# Patient Record
Sex: Male | Born: 1935 | Race: Black or African American | Hispanic: No | Marital: Married | Smoking: Former smoker
Health system: Southern US, Community
[De-identification: ages and names within clinical notes are randomized; demographics above are authoritative.]

## PROBLEM LIST (undated history)

## (undated) DIAGNOSIS — C61 Malignant neoplasm of prostate: Secondary | ICD-10-CM

## (undated) DIAGNOSIS — C9000 Multiple myeloma not having achieved remission: Secondary | ICD-10-CM

## (undated) DIAGNOSIS — I10 Essential (primary) hypertension: Secondary | ICD-10-CM

---

## 2013-10-01 ENCOUNTER — Emergency Department (HOSPITAL_COMMUNITY): Payer: Medicare Other

## 2013-10-01 ENCOUNTER — Encounter (HOSPITAL_COMMUNITY): Payer: Self-pay | Admitting: Emergency Medicine

## 2013-10-01 ENCOUNTER — Inpatient Hospital Stay (HOSPITAL_COMMUNITY)
Admission: EM | Admit: 2013-10-01 | Discharge: 2013-10-30 | DRG: 435 | Disposition: E | Payer: Medicare Other | Attending: Internal Medicine | Admitting: Internal Medicine

## 2013-10-01 DIAGNOSIS — Z23 Encounter for immunization: Secondary | ICD-10-CM

## 2013-10-01 DIAGNOSIS — C9000 Multiple myeloma not having achieved remission: Secondary | ICD-10-CM | POA: Diagnosis present

## 2013-10-01 DIAGNOSIS — Z66 Do not resuscitate: Secondary | ICD-10-CM | POA: Diagnosis present

## 2013-10-01 DIAGNOSIS — N179 Acute kidney failure, unspecified: Secondary | ICD-10-CM | POA: Diagnosis present

## 2013-10-01 DIAGNOSIS — J9600 Acute respiratory failure, unspecified whether with hypoxia or hypercapnia: Secondary | ICD-10-CM | POA: Diagnosis present

## 2013-10-01 DIAGNOSIS — R160 Hepatomegaly, not elsewhere classified: Secondary | ICD-10-CM | POA: Diagnosis present

## 2013-10-01 DIAGNOSIS — C61 Malignant neoplasm of prostate: Secondary | ICD-10-CM | POA: Diagnosis present

## 2013-10-01 DIAGNOSIS — Z87891 Personal history of nicotine dependence: Secondary | ICD-10-CM

## 2013-10-01 DIAGNOSIS — D649 Anemia, unspecified: Secondary | ICD-10-CM | POA: Diagnosis present

## 2013-10-01 DIAGNOSIS — E46 Unspecified protein-calorie malnutrition: Secondary | ICD-10-CM | POA: Diagnosis present

## 2013-10-01 DIAGNOSIS — C787 Secondary malignant neoplasm of liver and intrahepatic bile duct: Principal | ICD-10-CM | POA: Diagnosis present

## 2013-10-01 DIAGNOSIS — Z6835 Body mass index (BMI) 35.0-35.9, adult: Secondary | ICD-10-CM

## 2013-10-01 DIAGNOSIS — Z515 Encounter for palliative care: Secondary | ICD-10-CM

## 2013-10-01 DIAGNOSIS — R161 Splenomegaly, not elsewhere classified: Secondary | ICD-10-CM | POA: Diagnosis present

## 2013-10-01 DIAGNOSIS — R531 Weakness: Secondary | ICD-10-CM

## 2013-10-01 DIAGNOSIS — J189 Pneumonia, unspecified organism: Secondary | ICD-10-CM | POA: Diagnosis present

## 2013-10-01 DIAGNOSIS — R7989 Other specified abnormal findings of blood chemistry: Secondary | ICD-10-CM

## 2013-10-01 DIAGNOSIS — F40298 Other specified phobia: Secondary | ICD-10-CM | POA: Diagnosis present

## 2013-10-01 DIAGNOSIS — Z8546 Personal history of malignant neoplasm of prostate: Secondary | ICD-10-CM

## 2013-10-01 DIAGNOSIS — D638 Anemia in other chronic diseases classified elsewhere: Secondary | ICD-10-CM | POA: Diagnosis present

## 2013-10-01 DIAGNOSIS — C801 Malignant (primary) neoplasm, unspecified: Secondary | ICD-10-CM

## 2013-10-01 DIAGNOSIS — K5900 Constipation, unspecified: Secondary | ICD-10-CM | POA: Diagnosis present

## 2013-10-01 DIAGNOSIS — G9340 Encephalopathy, unspecified: Secondary | ICD-10-CM | POA: Diagnosis present

## 2013-10-01 DIAGNOSIS — Z79899 Other long term (current) drug therapy: Secondary | ICD-10-CM

## 2013-10-01 DIAGNOSIS — K810 Acute cholecystitis: Secondary | ICD-10-CM | POA: Diagnosis present

## 2013-10-01 DIAGNOSIS — C259 Malignant neoplasm of pancreas, unspecified: Secondary | ICD-10-CM | POA: Diagnosis present

## 2013-10-01 DIAGNOSIS — I10 Essential (primary) hypertension: Secondary | ICD-10-CM | POA: Diagnosis present

## 2013-10-01 DIAGNOSIS — Z7982 Long term (current) use of aspirin: Secondary | ICD-10-CM

## 2013-10-01 DIAGNOSIS — N19 Unspecified kidney failure: Secondary | ICD-10-CM | POA: Diagnosis present

## 2013-10-01 DIAGNOSIS — R945 Abnormal results of liver function studies: Secondary | ICD-10-CM

## 2013-10-01 DIAGNOSIS — R109 Unspecified abdominal pain: Secondary | ICD-10-CM | POA: Diagnosis present

## 2013-10-01 DIAGNOSIS — Z809 Family history of malignant neoplasm, unspecified: Secondary | ICD-10-CM

## 2013-10-01 HISTORY — DX: Malignant neoplasm of prostate: C61

## 2013-10-01 HISTORY — DX: Multiple myeloma not having achieved remission: C90.00

## 2013-10-01 HISTORY — DX: Essential (primary) hypertension: I10

## 2013-10-01 LAB — CBC
HCT: 26.5 % — ABNORMAL LOW (ref 39.0–52.0)
Hemoglobin: 8.4 g/dL — ABNORMAL LOW (ref 13.0–17.0)
MCH: 29.8 pg (ref 26.0–34.0)
MCHC: 31.7 g/dL (ref 30.0–36.0)
MCV: 94 fL (ref 78.0–100.0)
PLATELETS: 199 10*3/uL (ref 150–400)
RBC: 2.82 MIL/uL — ABNORMAL LOW (ref 4.22–5.81)
RDW: 18.1 % — ABNORMAL HIGH (ref 11.5–15.5)
WBC: 6.9 10*3/uL (ref 4.0–10.5)

## 2013-10-01 LAB — COMPREHENSIVE METABOLIC PANEL
ALBUMIN: 2.9 g/dL — AB (ref 3.5–5.2)
ALT: 102 U/L — ABNORMAL HIGH (ref 0–53)
AST: 107 U/L — ABNORMAL HIGH (ref 0–37)
Alkaline Phosphatase: 325 U/L — ABNORMAL HIGH (ref 39–117)
BILIRUBIN TOTAL: 1.6 mg/dL — AB (ref 0.3–1.2)
BUN: 32 mg/dL — AB (ref 6–23)
CALCIUM: 10.2 mg/dL (ref 8.4–10.5)
CHLORIDE: 103 meq/L (ref 96–112)
CO2: 20 mEq/L (ref 19–32)
Creatinine, Ser: 1.63 mg/dL — ABNORMAL HIGH (ref 0.50–1.35)
GFR calc Af Amer: 46 mL/min — ABNORMAL LOW (ref >90)
GFR calc non Af Amer: 39 mL/min — ABNORMAL LOW (ref >90)
Glucose, Bld: 158 mg/dL — ABNORMAL HIGH (ref 70–99)
Potassium: 5.4 mEq/L — ABNORMAL HIGH (ref 3.7–5.3)
Sodium: 139 mEq/L (ref 137–147)
Total Protein: 8 g/dL (ref 6.0–8.3)

## 2013-10-01 LAB — D-DIMER, QUANTITATIVE (NOT AT ARMC)

## 2013-10-01 LAB — URINALYSIS, ROUTINE W REFLEX MICROSCOPIC
Glucose, UA: NEGATIVE mg/dL
Hgb urine dipstick: NEGATIVE
Ketones, ur: 15 mg/dL — AB
Nitrite: NEGATIVE
PROTEIN: NEGATIVE mg/dL
SPECIFIC GRAVITY, URINE: 1.025 (ref 1.005–1.030)
UROBILINOGEN UA: 1 mg/dL (ref 0.0–1.0)
pH: 5 (ref 5.0–8.0)

## 2013-10-01 LAB — URINE MICROSCOPIC-ADD ON

## 2013-10-01 LAB — GLUCOSE, CAPILLARY: GLUCOSE-CAPILLARY: 156 mg/dL — AB (ref 70–99)

## 2013-10-01 LAB — POCT I-STAT TROPONIN I: TROPONIN I, POC: 0.03 ng/mL (ref 0.00–0.08)

## 2013-10-01 LAB — LIPASE, BLOOD: Lipase: 37 U/L (ref 11–59)

## 2013-10-01 LAB — ABO/RH: ABO/RH(D): B POS

## 2013-10-01 MED ORDER — IOHEXOL 350 MG/ML SOLN
100.0000 mL | Freq: Once | INTRAVENOUS | Status: AC | PRN
  Administered 2013-10-01: 100 mL via INTRAVENOUS

## 2013-10-01 MED ORDER — SODIUM CHLORIDE 0.9 % IV BOLUS (SEPSIS)
500.0000 mL | Freq: Once | INTRAVENOUS | Status: AC
  Administered 2013-10-02: 500 mL via INTRAVENOUS

## 2013-10-01 MED ORDER — SODIUM CHLORIDE 0.9 % IV BOLUS (SEPSIS)
500.0000 mL | Freq: Once | INTRAVENOUS | Status: AC
  Administered 2013-10-01: 500 mL via INTRAVENOUS

## 2013-10-01 MED ORDER — DEXTROSE 5 % IV SOLN: 1.0000 g | Freq: Once | INTRAVENOUS | Status: DC

## 2013-10-01 NOTE — ED Notes (Addendum)
Pt states he has felt SOB at night and having abd pain for past month but worse today. States he has felt 'weak and worn out." pt received blood transfusion at [facility] hospital recently for anemia per family member. A&Ox4, resp e/u

## 2013-10-01 NOTE — ED Notes (Signed)
Patient transported to X-ray 

## 2013-10-01 NOTE — ED Notes (Signed)
Brother - Wynetta Emery  [phone] Wife - Francesca Jewett [phone] [place] - [phone]

## 2013-10-01 NOTE — ED Notes (Signed)
Lab Drawn attempted in triage with no success, LAB aware

## 2013-10-01 NOTE — ED Provider Notes (Signed)
Discussed case with Dr. Purcell Mouton, PA-C. Transfer of care from Dr. Purcell Mouton, PA-C at change in shift. Plan is for admission of patient.  If d-dimer negative, give one unit of blood to the patient and CAP antibiotics.   Joah Patlan is a 79 year old male with past medical history of multiple myeloma, prostate cancer, hypertension presenting to the emergency department with dyspnea upon exertion, dizziness, feeling of fainting sensation, shortness of breath when walking. Reported that when patient lies down he feels better. Reported that approximately 2 weeks ago he was diagnosed as being anemic which she was given a blood transfusion. Reported that he's been experiencing upper abdominal pain-localize the right upper quadrant and epigastric region.  While in ED setting patient's heart rate has been tachycardic-with heart rate ranging between 120s. Patient is been given 500 cc fluid with heart rate resting approximately 110 beats per minute, when talking increases to 120s. Mild low-grade fever 100.3F, no cough noted.  Results for orders placed during the hospital encounter of 10/28/2013  CBC      Result Value Range   WBC 6.9  4.0 - 10.5 K/uL   RBC 2.82 (*) 4.22 - 5.81 MIL/uL   Hemoglobin 8.4 (*) 13.0 - 17.0 g/dL   HCT 26.5 (*) 39.0 - 52.0 %   MCV 94.0  78.0 - 100.0 fL   MCH 29.8  26.0 - 34.0 pg   MCHC 31.7  30.0 - 36.0 g/dL   RDW 18.1 (*) 11.5 - 15.5 %   Platelets 199  150 - 400 K/uL  COMPREHENSIVE METABOLIC PANEL      Result Value Range   Sodium 139  137 - 147 mEq/L   Potassium 5.4 (*) 3.7 - 5.3 mEq/L   Chloride 103  96 - 112 mEq/L   CO2 20  19 - 32 mEq/L   Glucose, Bld 158 (*) 70 - 99 mg/dL   BUN 32 (*) 6 - 23 mg/dL   Creatinine, Ser 1.63 (*) 0.50 - 1.35 mg/dL   Calcium 10.2  8.4 - 10.5 mg/dL   Total Protein 8.0  6.0 - 8.3 g/dL   Albumin 2.9 (*) 3.5 - 5.2 g/dL   AST 107 (*) 0 - 37 U/L   ALT 102 (*) 0 - 53 U/L   Alkaline Phosphatase 325 (*) 39 - 117 U/L   Total Bilirubin 1.6 (*)  0.3 - 1.2 mg/dL   GFR calc non Af Amer 39 (*) >90 mL/min   GFR calc Af Amer 46 (*) >90 mL/min  GLUCOSE, CAPILLARY      Result Value Range   Glucose-Capillary 156 (*) 70 - 99 mg/dL   Comment 1 Notify RN     Comment 2 Documented in Chart    LIPASE, BLOOD      Result Value Range   Lipase 37  11 - 59 U/L  POCT I-STAT TROPONIN I      Result Value Range   Troponin i, poc 0.03  0.00 - 0.08 ng/mL   Comment 3           TYPE AND SCREEN      Result Value Range   ABO/RH(D) B POS     Antibody Screen NEG     Sample Expiration 10/04/2013    ABO/RH      Result Value Range   ABO/RH(D) B POS     Dg Chest 2 View  10/11/2013   CLINICAL DATA:  Shortness of breath. Weakness. Multiple myeloma. Prostate carcinoma.  EXAM:  CHEST  2 VIEW  COMPARISON:  None.  FINDINGS: Low lung volumes are noted. Mild opacity seen at the right lung base which may be due to atelectasis or infiltrate. Left lung appears clear. No evidence of pleural effusion.  IMPRESSION: Low lung volumes.  Mild right basilar atelectasis versus infiltrate.   Electronically Signed   By: Earle Gell M.D.   On: 10/20/2013 18:44   US Abdomen Complete  10/02/2013   CLINICAL DATA:  Upper abdominal pain and shortness of breath. History multiple myeloma and prostate cancer.  EXAM: ULTRASOUND ABDOMEN COMPLETE  COMPARISON:  None.  FINDINGS: Gallbladder:  No evidence of cholelithiasis. Mild gallbladder wall thickening measuring 4.8 mm. Positive sonographic Murphy's sign.  Common bile duct:  Diameter: 6.7 to 8.1 mm.  Liver:  Heterogeneous echotexture with several rounded hypoechoic rimmed masses.  IVC:  No abnormality visualized.  Pancreas:  Visualized portion unremarkable.  Spleen:  Within normal in size with a 4.9 cm area of mild increased echogenicity as cannot exclude a mass.  Right Kidney:  Length: 11.9 cm. Two adjacent rounded hypo to anechoic masses over the upper pole with mild increased through transmission likely cysts. These measure 1.5 and 2.8 cm for  respectively.  Left Kidney:  Length: 11.6 cm. Echogenicity within normal limits. No mass or hydronephrosis visualized.  Abdominal aorta:  3.3 cm in AP diameter proximally.  Other findings:  None.  IMPRESSION: Heterogeneous echotexture to the liver with several rounded hypoechoic rimmed masses which may represent metastatic disease. 4.9 cm hyperechoic region of the spleen as cannot exclude a mass. Recommend CT of the abdomen/ pelvis for further evaluation.  No evidence of cholelithiasis. Mild gallbladder wall thickening measuring 4.8 mm with positive sonographic Murphy sign per recommend clinical correlation.  Two adjacent cysts over the superior pole of the right kidney measuring 1.5 and 2.8 cm respectively.  Minimal aneurysmal dilatation of the proximal abdominal aorta measuring 3.3 cm in AP diameter.   Electronically Signed   By: Marin Olp M.D.   On: 10/21/2013 22:08   11:24 PM This provider spoke with Dr. Donne Hazel, General surgery - discussed case, labs, history, presentation, imaging. Dr. Donne Hazel to assess patient.   12:13 AM This provider saw and re-assessed patient. Discussed lab findings and imaging results. Discussed with patient plan for admission. Pale conjunctiva bilaterally. Cap refill < 3 seconds. Negative leg swelling or pitting edema noted. Dry mucus membranes noted. Heart rate and rhythm normal, pulses palpable and strong radial and DP 2+ bilaterally. BS normoactive in all 4 quadrants. Discomfort upon palpation to the epigastric and RUQ - positive murphy's. Strength intact with equal distribution.   12:34 AM This provider spoke with Dr. Marlowe Sax from [facility]. Discussed case, presentation, history, labs, imaging, vitals. Recommended CT abdomen and pelvis to be discontinued. Recommended patient to be admitted to the hospital via Telemetry as inpatient. Patient stable for transfer. Discussed plan for patient to be admitted to the hospital. Patient agreed to plan and  understood.  Jamse Mead, PA-C 10/02/13 [phone]

## 2013-10-01 NOTE — ED Provider Notes (Signed)
CSN: [id number]     Arrival date & time 10/11/2013  1643 History   First MD Initiated Contact with Patient 10/14/2013 1709     Chief Complaint  Patient presents with  . Weakness   (Consider location/radiation/quality/duration/timing/severity/associated sxs/prior Treatment) Patient is a 79 y.o. male presenting with shortness of breath. The history is provided by the patient.  Shortness of Breath Severity:  Moderate Onset quality:  Gradual Duration:  2 weeks Timing:  Constant Progression:  Worsening Chronicity:  New Context comment:  Seen at [place] 2 weeks ago and told he was anemic and given blood transfusion but sx have worsened Relieved by:  Lying down and rest Worsened by:  Exertion and movement (standing) Ineffective treatments:  None tried Associated symptoms: abdominal pain   Associated symptoms: no chest pain, no cough, no fever, no sore throat, no sputum production, no vomiting and no wheezing   Associated symptoms comment:  Abdominal pain while breathing hard.  Lightheaded anytime he stands up.  Severe SOB with any exertion and fatigue Risk factors comment:  Multiple myeloma and anemia   Past Medical History  Diagnosis Date  . Prostate cancer   . Multiple myeloma   . Hypertension    History reviewed. No pertinent past surgical history. History reviewed. No pertinent family history. History  Substance Use Topics  . Smoking status: Former Research scientist (life sciences)  . Smokeless tobacco: Not on file  . Alcohol Use: No    Review of Systems  Constitutional: Positive for fatigue. Negative for fever.  HENT: Negative for sore throat.   Respiratory: Positive for shortness of breath. Negative for cough, sputum production and wheezing.   Cardiovascular: Negative for chest pain.  Gastrointestinal: Positive for abdominal pain. Negative for vomiting.  All other systems reviewed and are negative.    Allergies  Review of patient's allergies indicates no known allergies.  Home Medications  No  current outpatient prescriptions on file. BP 107/76  Pulse 126  Temp(Src) 100.3 F (37.9 C) (Oral)  Resp 26  SpO2 100% Physical Exam  Nursing note and vitals reviewed. Constitutional: He is oriented to person, place, and time. He appears well-developed and well-nourished. No distress.  HENT:  Head: Normocephalic and atraumatic.  Mouth/Throat: Oropharynx is clear and moist. Mucous membranes are dry.  Eyes: EOM are normal. Pupils are equal, round, and reactive to light.  Pale conjuctiva  Neck: Normal range of motion. Neck supple.  Cardiovascular: Regular rhythm and intact distal pulses.  Tachycardia present.   No murmur heard. Pulmonary/Chest: Effort normal and breath sounds normal. No respiratory distress. He has no wheezes. He has no rales.  Abdominal: Soft. He exhibits no distension. There is tenderness in the right upper quadrant, epigastric area and left upper quadrant. There is no rebound and no guarding.    Musculoskeletal: Normal range of motion. He exhibits no edema and no tenderness.  Neurological: He is alert and oriented to person, place, and time.  Skin: Skin is warm and dry. No rash noted. No erythema. There is pallor.  Psychiatric: He has a normal mood and affect. His behavior is normal.    ED Course  Procedures (including critical care time) Labs Review Labs Reviewed  CBC - Abnormal; Notable for the following:    RBC 2.82 (*)    Hemoglobin 8.4 (*)    HCT 26.5 (*)    RDW 18.1 (*)    All other components within normal limits  COMPREHENSIVE METABOLIC PANEL - Abnormal; Notable for the following:    Potassium 5.4 (*)  Glucose, Bld 158 (*)    BUN 32 (*)    Creatinine, Ser 1.63 (*)    Albumin 2.9 (*)    AST 107 (*)    ALT 102 (*)    Alkaline Phosphatase 325 (*)    Total Bilirubin 1.6 (*)    GFR calc non Af Amer 39 (*)    GFR calc Af Amer 46 (*)    All other components within normal limits  URINALYSIS, ROUTINE W REFLEX MICROSCOPIC - Abnormal; Notable for the  following:    Color, Urine AMBER (*)    APPearance CLOUDY (*)    Bilirubin Urine MODERATE (*)    Ketones, ur 15 (*)    Leukocytes, UA TRACE (*)    All other components within normal limits  GLUCOSE, CAPILLARY - Abnormal; Notable for the following:    Glucose-Capillary 156 (*)    All other components within normal limits  D-DIMER, QUANTITATIVE - Abnormal; Notable for the following:    D-Dimer, Quant >20.00 (*)    All other components within normal limits  URINE MICROSCOPIC-ADD ON - Abnormal; Notable for the following:    Bacteria, UA MANY (*)    Casts HYALINE CASTS (*)    All other components within normal limits  CBC WITH DIFFERENTIAL - Abnormal; Notable for the following:    RBC 2.34 (*)    Hemoglobin 7.1 (*)    HCT 21.7 (*)    RDW 17.3 (*)    All other components within normal limits  COMPREHENSIVE METABOLIC PANEL - Abnormal; Notable for the following:    Glucose, Bld 119 (*)    BUN 36 (*)    Creatinine, Ser 1.63 (*)    Albumin 2.5 (*)    AST 84 (*)    ALT 83 (*)    Alkaline Phosphatase 276 (*)    Total Bilirubin 1.3 (*)    GFR calc non Af Amer 39 (*)    GFR calc Af Amer 46 (*)    All other components within normal limits  PROTIME-INR - Abnormal; Notable for the following:    Prothrombin Time 16.3 (*)    All other components within normal limits  CULTURE, BLOOD (ROUTINE X 2)  CULTURE, BLOOD (ROUTINE X 2)  CULTURE, EXPECTORATED SPUTUM-ASSESSMENT  GRAM STAIN  LIPASE, BLOOD  LEGIONELLA ANTIGEN, URINE  STREP PNEUMONIAE URINARY ANTIGEN  POCT I-STAT TROPONIN I  POCT I-STAT TROPONIN I  TYPE AND SCREEN  ABO/RH   Imaging Review Dg Chest 2 View  10/22/2013   CLINICAL DATA:  Shortness of breath. Weakness. Multiple myeloma. Prostate carcinoma.  EXAM: CHEST  2 VIEW  COMPARISON:  None.  FINDINGS: Low lung volumes are noted. Mild opacity seen at the right lung base which may be due to atelectasis or infiltrate. Left lung appears clear. No evidence of pleural effusion.   IMPRESSION: Low lung volumes.  Mild right basilar atelectasis versus infiltrate.   Electronically Signed   By: Earle Gell M.D.   On: 10/04/2013 18:44   Ct Angio Chest Pe W/cm &/or Wo Cm  10/02/2013   CLINICAL DATA:  Shortness of breath. Elevated D-dimer. Clinical suspicion for pulmonary embolism. History of prostate carcinoma and multiple myeloma.  EXAM: CT ANGIOGRAPHY CHEST WITH CONTRAST  TECHNIQUE: Multidetector CT imaging of the chest was performed using the standard protocol during bolus administration of intravenous contrast. Multiplanar CT image reconstructions including MIPs were obtained to evaluate the vascular anatomy.  CONTRAST:  163m OMNIPAQUE IOHEXOL 350 MG/ML SOLN  COMPARISON:  None.  FINDINGS: Satisfactory opacification of pulmonary arteries noted, and no pulmonary emboli identified. No evidence of thoracic aortic dissection or aneurysm. No evidence of mediastinal hematoma or mass.  No lymphadenopathy identified within the thorax. Tiny pleural effusions versus pleural thickening noted bilaterally. Mild cardiomegaly noted. No evidence of pulmonary infiltrate or mass.  Images obtained through the upper abdomen show heterogeneous attenuation of the liver, which may be due to the presence of multiple hypovascular liver metastases.  Review of the MIP images confirms the above findings.  IMPRESSION: No evidence of pulmonary embolism.  Mild cardiomegaly and tiny bilateral pleural effusions versus pleural thickening.  Heterogeneous appearance of liver noted, and multiple hypovascular metastases cannot be excluded. Recommend further imaging characterization with nonemergent abdomen MRI without and with contrast as the preferred exam.   Electronically Signed   By: Earle Gell M.D.   On: 10/02/2013 00:07   US Abdomen Complete  10/13/2013   CLINICAL DATA:  Upper abdominal pain and shortness of breath. History multiple myeloma and prostate cancer.  EXAM: ULTRASOUND ABDOMEN COMPLETE  COMPARISON:  None.   FINDINGS: Gallbladder:  No evidence of cholelithiasis. Mild gallbladder wall thickening measuring 4.8 mm. Positive sonographic Murphy's sign.  Common bile duct:  Diameter: 6.7 to 8.1 mm.  Liver:  Heterogeneous echotexture with several rounded hypoechoic rimmed masses.  IVC:  No abnormality visualized.  Pancreas:  Visualized portion unremarkable.  Spleen:  Within normal in size with a 4.9 cm area of mild increased echogenicity as cannot exclude a mass.  Right Kidney:  Length: 11.9 cm. Two adjacent rounded hypo to anechoic masses over the upper pole with mild increased through transmission likely cysts. These measure 1.5 and 2.8 cm for respectively.  Left Kidney:  Length: 11.6 cm. Echogenicity within normal limits. No mass or hydronephrosis visualized.  Abdominal aorta:  3.3 cm in AP diameter proximally.  Other findings:  None.  IMPRESSION: Heterogeneous echotexture to the liver with several rounded hypoechoic rimmed masses which may represent metastatic disease. 4.9 cm hyperechoic region of the spleen as cannot exclude a mass. Recommend CT of the abdomen/ pelvis for further evaluation.  No evidence of cholelithiasis. Mild gallbladder wall thickening measuring 4.8 mm with positive sonographic Murphy sign per recommend clinical correlation.  Two adjacent cysts over the superior pole of the right kidney measuring 1.5 and 2.8 cm respectively.  Minimal aneurysmal dilatation of the proximal abdominal aorta measuring 3.3 cm in AP diameter.   Electronically Signed   By: Marin Olp M.D.   On: 10/26/2013 22:08    EKG Interpretation    Date/Time:  Saturday October 01 2013 16:53:36 EST Ventricular Rate:  139 PR Interval:  124 QRS Duration: 94 QT Interval:  290 QTC Calculation: 441 R Axis:   -41 Text Interpretation:  Sinus tachycardia Left axis deviation Moderate voltage criteria for LVH, may be normal variant Nonspecific ST abnormality No previous tracing Confirmed by Maryan Rued  MD, Abel Hageman ([id number]) on 10/05/2013  5:12:06 PM            MDM  No diagnosis found.  Pt with sx concerning for anemia with SOB, lightheadness and fatigue when standing up and walking any distance that has worsened over the last 2 weeks.  Denies infectious sx, CP, cough or fluid overload.  No signs of fluid overload on exam and sinus tachy on ekg.  Pale conjunctiva and concern for anemia.  Pt has hx of MM and blood transfusion 2 weeks ago at the [place] but no improvement of sx.  No neuro  complaints.  Pt is assymptomatic while lying down but still tachy.  Also appears dehydrated.  CBC, CMP, UA, troponin, type and screen, chest x-ray pending.  Hb 8.4 without baseline which could be the cause of sx however given tachy, sob and abd pain will get d-dimer and U/S to further eval.   Blanchie Dessert, MD 10/02/13 1114

## 2013-10-02 ENCOUNTER — Inpatient Hospital Stay (HOSPITAL_COMMUNITY): Payer: Medicare Other

## 2013-10-02 ENCOUNTER — Other Ambulatory Visit (HOSPITAL_COMMUNITY): Payer: Medicare Other

## 2013-10-02 DIAGNOSIS — C9000 Multiple myeloma not having achieved remission: Secondary | ICD-10-CM

## 2013-10-02 DIAGNOSIS — K810 Acute cholecystitis: Secondary | ICD-10-CM | POA: Insufficient documentation

## 2013-10-02 DIAGNOSIS — I10 Essential (primary) hypertension: Secondary | ICD-10-CM

## 2013-10-02 DIAGNOSIS — R161 Splenomegaly, not elsewhere classified: Secondary | ICD-10-CM | POA: Diagnosis present

## 2013-10-02 DIAGNOSIS — J189 Pneumonia, unspecified organism: Secondary | ICD-10-CM

## 2013-10-02 DIAGNOSIS — C61 Malignant neoplasm of prostate: Secondary | ICD-10-CM

## 2013-10-02 DIAGNOSIS — D490 Neoplasm of unspecified behavior of digestive system: Secondary | ICD-10-CM

## 2013-10-02 DIAGNOSIS — R160 Hepatomegaly, not elsewhere classified: Secondary | ICD-10-CM | POA: Diagnosis present

## 2013-10-02 DIAGNOSIS — K769 Liver disease, unspecified: Secondary | ICD-10-CM

## 2013-10-02 DIAGNOSIS — R109 Unspecified abdominal pain: Secondary | ICD-10-CM

## 2013-10-02 DIAGNOSIS — D649 Anemia, unspecified: Secondary | ICD-10-CM | POA: Diagnosis present

## 2013-10-02 DIAGNOSIS — N19 Unspecified kidney failure: Secondary | ICD-10-CM | POA: Diagnosis present

## 2013-10-02 LAB — CBC WITH DIFFERENTIAL/PLATELET
BASOS PCT: 0 % (ref 0–1)
Basophils Absolute: 0 10*3/uL (ref 0.0–0.1)
EOS ABS: 0 10*3/uL (ref 0.0–0.7)
Eosinophils Relative: 0 % (ref 0–5)
HCT: 21.7 % — ABNORMAL LOW (ref 39.0–52.0)
Hemoglobin: 7.1 g/dL — ABNORMAL LOW (ref 13.0–17.0)
Lymphocytes Relative: 14 % (ref 12–46)
Lymphs Abs: 0.9 10*3/uL (ref 0.7–4.0)
MCH: 30.3 pg (ref 26.0–34.0)
MCHC: 32.7 g/dL (ref 30.0–36.0)
MCV: 92.7 fL (ref 78.0–100.0)
Monocytes Absolute: 0.7 10*3/uL (ref 0.1–1.0)
Monocytes Relative: 11 % (ref 3–12)
Neutro Abs: 4.9 10*3/uL (ref 1.7–7.7)
Neutrophils Relative %: 75 % (ref 43–77)
PLATELETS: 153 10*3/uL (ref 150–400)
RBC: 2.34 MIL/uL — ABNORMAL LOW (ref 4.22–5.81)
RDW: 17.3 % — ABNORMAL HIGH (ref 11.5–15.5)
WBC: 6.5 10*3/uL (ref 4.0–10.5)

## 2013-10-02 LAB — COMPREHENSIVE METABOLIC PANEL
ALK PHOS: 276 U/L — AB (ref 39–117)
ALT: 83 U/L — AB (ref 0–53)
AST: 84 U/L — ABNORMAL HIGH (ref 0–37)
Albumin: 2.5 g/dL — ABNORMAL LOW (ref 3.5–5.2)
BUN: 36 mg/dL — ABNORMAL HIGH (ref 6–23)
CO2: 21 mEq/L (ref 19–32)
Calcium: 9.5 mg/dL (ref 8.4–10.5)
Chloride: 105 mEq/L (ref 96–112)
Creatinine, Ser: 1.63 mg/dL — ABNORMAL HIGH (ref 0.50–1.35)
GFR calc Af Amer: 46 mL/min — ABNORMAL LOW (ref >90)
GFR calc non Af Amer: 39 mL/min — ABNORMAL LOW (ref >90)
Glucose, Bld: 119 mg/dL — ABNORMAL HIGH (ref 70–99)
Potassium: 4.3 mEq/L (ref 3.7–5.3)
Sodium: 138 mEq/L (ref 137–147)
TOTAL PROTEIN: 7.2 g/dL (ref 6.0–8.3)
Total Bilirubin: 1.3 mg/dL — ABNORMAL HIGH (ref 0.3–1.2)

## 2013-10-02 LAB — MAGNESIUM: MAGNESIUM: 2.2 mg/dL (ref 1.5–2.5)

## 2013-10-02 LAB — POCT I-STAT TROPONIN I: Troponin i, poc: 0.07 ng/mL (ref 0.00–0.08)

## 2013-10-02 LAB — STREP PNEUMONIAE URINARY ANTIGEN: Strep Pneumo Urinary Antigen: NEGATIVE

## 2013-10-02 LAB — PROTIME-INR
INR: 1.34 (ref 0.00–1.49)
Prothrombin Time: 16.3 seconds — ABNORMAL HIGH (ref 11.6–15.2)

## 2013-10-02 LAB — PREPARE RBC (CROSSMATCH)

## 2013-10-02 MED ORDER — PIPERACILLIN-TAZOBACTAM 3.375 G IVPB
3.3750 g | Freq: Three times a day (TID) | INTRAVENOUS | Status: DC
  Administered 2013-10-02 – 2013-10-04 (×7): 3.375 g via INTRAVENOUS
  Filled 2013-10-02 (×8): qty 50

## 2013-10-02 MED ORDER — DEXTROSE 5 % IV SOLN
1.0000 g | Freq: Once | INTRAVENOUS | Status: AC
  Administered 2013-10-02: 1 g via INTRAVENOUS
  Filled 2013-10-02: qty 10

## 2013-10-02 MED ORDER — VANCOMYCIN HCL 10 G IV SOLR
2000.0000 mg | Freq: Once | INTRAVENOUS | Status: AC
  Administered 2013-10-02: 2000 mg via INTRAVENOUS
  Filled 2013-10-02: qty 2000

## 2013-10-02 MED ORDER — TERAZOSIN HCL 5 MG PO CAPS
20.0000 mg | ORAL_CAPSULE | Freq: Every day | ORAL | Status: DC
  Administered 2013-10-02 – 2013-10-13 (×12): 20 mg via ORAL
  Filled 2013-10-02 (×14): qty 4

## 2013-10-02 MED ORDER — BIOTENE DRY MOUTH MT LIQD
15.0000 mL | Freq: Two times a day (BID) | OROMUCOSAL | Status: DC
  Administered 2013-10-02 – 2013-10-18 (×29): 15 mL via OROMUCOSAL

## 2013-10-02 MED ORDER — ALLOPURINOL 100 MG PO TABS
100.0000 mg | ORAL_TABLET | Freq: Every day | ORAL | Status: DC
  Administered 2013-10-02 – 2013-10-14 (×13): 100 mg via ORAL
  Filled 2013-10-02 (×13): qty 1

## 2013-10-02 MED ORDER — LOSARTAN POTASSIUM 25 MG PO TABS
25.0000 mg | ORAL_TABLET | Freq: Every day | ORAL | Status: DC
  Administered 2013-10-02: 13:00:00 25 mg via ORAL
  Filled 2013-10-02 (×2): qty 1

## 2013-10-02 MED ORDER — MORPHINE SULFATE 2 MG/ML IJ SOLN
1.0000 mg | INTRAMUSCULAR | Status: DC | PRN
Start: 2013-10-02 — End: 2013-10-02

## 2013-10-02 MED ORDER — TECHNETIUM TC 99M MEBROFENIN IV KIT
5.0000 | PACK | Freq: Once | INTRAVENOUS | Status: AC | PRN
  Administered 2013-10-02: 11:00:00 5 via INTRAVENOUS

## 2013-10-02 MED ORDER — PNEUMOCOCCAL VAC POLYVALENT 25 MCG/0.5ML IJ INJ
0.5000 mL | INJECTION | INTRAMUSCULAR | Status: AC
  Administered 2013-10-03: 10:00:00 0.5 mL via INTRAMUSCULAR
  Filled 2013-10-02: qty 0.5

## 2013-10-02 MED ORDER — SODIUM CHLORIDE 0.9 % IV SOLN
INTRAVENOUS | Status: DC
  Administered 2013-10-02 – 2013-10-03 (×2): via INTRAVENOUS
  Administered 2013-10-04: 17:00:00 1000 mL via INTRAVENOUS
  Administered 2013-10-06: 20:00:00 via INTRAVENOUS

## 2013-10-02 MED ORDER — HYDRALAZINE HCL 20 MG/ML IJ SOLN
10.0000 mg | INTRAMUSCULAR | Status: DC | PRN
  Administered 2013-10-05 – 2013-10-15 (×4): 10 mg via INTRAVENOUS
  Filled 2013-10-02 (×4): qty 1

## 2013-10-02 MED ORDER — ASPIRIN EC 325 MG PO TBEC
325.0000 mg | DELAYED_RELEASE_TABLET | Freq: Every day | ORAL | Status: DC
  Administered 2013-10-02 – 2013-10-14 (×13): 325 mg via ORAL
  Filled 2013-10-02 (×13): qty 1

## 2013-10-02 MED ORDER — DEXTROSE 5 % IV SOLN
500.0000 mg | Freq: Once | INTRAVENOUS | Status: AC
  Administered 2013-10-02: 500 mg via INTRAVENOUS

## 2013-10-02 MED ORDER — SODIUM CHLORIDE 0.9 % IV SOLN
1750.0000 mg | INTRAVENOUS | Status: DC
  Administered 2013-10-02 – 2013-10-04 (×2): 1750 mg via INTRAVENOUS
  Filled 2013-10-02 (×3): qty 1750

## 2013-10-02 MED ORDER — SENNOSIDES-DOCUSATE SODIUM 8.6-50 MG PO TABS
1.0000 | ORAL_TABLET | Freq: Two times a day (BID) | ORAL | Status: DC
  Administered 2013-10-02 – 2013-10-16 (×28): 1 via ORAL
  Filled 2013-10-02 (×41): qty 1

## 2013-10-02 MED ORDER — PIPERACILLIN-TAZOBACTAM 3.375 G IVPB
3.3750 g | Freq: Four times a day (QID) | INTRAVENOUS | Status: DC
  Administered 2013-10-02 (×2): 3.375 g via INTRAVENOUS
  Filled 2013-10-02 (×3): qty 50

## 2013-10-02 MED ORDER — METOPROLOL TARTRATE 50 MG PO TABS
50.0000 mg | ORAL_TABLET | Freq: Two times a day (BID) | ORAL | Status: DC
  Administered 2013-10-02 – 2013-10-03 (×4): 50 mg via ORAL
  Filled 2013-10-02 (×8): qty 1

## 2013-10-02 MED ORDER — HEPARIN SODIUM (PORCINE) 5000 UNIT/ML IJ SOLN
5000.0000 [IU] | Freq: Three times a day (TID) | INTRAMUSCULAR | Status: DC
  Administered 2013-10-02 – 2013-10-03 (×2): 5000 [IU] via SUBCUTANEOUS
  Filled 2013-10-02 (×7): qty 1

## 2013-10-02 NOTE — ED Notes (Signed)
Pt. States he uses 2 canes to ambulate at home.

## 2013-10-02 NOTE — Progress Notes (Signed)
1243: tele alert pt had 5 beats of VTach transporting back from [facility]. Dr. Grandville Silos paged and made aware.

## 2013-10-02 NOTE — H&P (Signed)
[facility] History and Physical  Patient: Gilbert Deleon  QQP:[id number]  DOB: April 22, 1935  DOS: the patient was seen and examined on 10/02/2013 PCP: No PCP Per Patient  Chief Complaint: Shortness of breath  HPI: Gilbert Deleon is a 79 y.o. male with Past medical history of Multiple myelomaNot on any therapy since August 2013, prostate cancer treated with hormonal therapy only. The patient is coming from home. Patient presents with complaints of shortness of breath that has been ongoing on exertion since last 2-3 months. He mentions that the shortness of breath was pretty much stable on distance last 1 week weight progressively got worse and today was worse since morning. He did Have some cough Without any sputum production. He denies any chest pain, palpitation. He felt feverish but no chills. He denies any nausea or vomiting. He does mention about heartburn and midepigastric pain.  On further questioning of right upper Quadrant pain He mentions That he has this pain since last 2 months and it is not associated with meal. He denies any diarrhea but mentions that he has chronic constipation which can go for a week. He also mentions about bloating sensation in his abdomen and belching. He mentions in 2010 he was diagnosed with Prostate cancer and multiple myeloma. 4 prostate cancer he has undergone only hormonal therapy without any chemotherapy or radiation or surgery. 4 multiple myeloma he was placed on thalidomide, mycophenolate, Revlimid with prednisone and since August 2013 he's not taking any medication. All this treatment has been done at [place]. 2 weeks ago he was admitted at [facility] and was also given blood transfusion for anemia. He mentions that he was admitted at [facility] 3 times in last 1 month due to complaints of generalized weakness.  Review of Systems: as mentioned in the history of present illness.  A Comprehensive review of the other systems is negative.  Past Medical History   Diagnosis Date  . Prostate cancer   . Multiple myeloma   . Hypertension    History reviewed. No pertinent past surgical history. Social History:  reports that he has quit smoking. He does not have any smokeless tobacco history on file. He reports that he does not drink alcohol or use illicit drugs. Independent for most of his  ADL.  No Known Allergies  History reviewed. No pertinent family history.  Prior to Admission medications   Medication Sig Start Date End Date Taking? Authorizing Provider  allopurinol (ZYLOPRIM) 100 MG tablet Take 100 mg by mouth daily.   Yes Historical Provider, MD  aspirin EC 325 MG tablet Take 325 mg by mouth daily.   Yes Historical Provider, MD  Calcium Carbonate-Vitamin D (CALCIUM 600+D) 600-400 MG-UNIT per tablet Take 1 tablet by mouth daily.   Yes Historical Provider, MD  Glucosamine-Chondroitin (GLUCOSAMINE CHONDR COMPLEX PO) Take 1 tablet by mouth daily.   Yes Historical Provider, MD  latanoprost (XALATAN) 0.005 % ophthalmic solution Place 1 drop into both eyes at bedtime.   Yes Historical Provider, MD  losartan (COZAAR) 25 MG tablet Take 25 mg by mouth daily.   Yes Historical Provider, MD  metoprolol (LOPRESSOR) 50 MG tablet Take 50 mg by mouth 2 (two) times daily.   Yes Historical Provider, MD  Petrolatum-Zinc Oxide (SENSI-CARE PROTECTIVE BARRIER EX) Apply 1 application topically 2 (two) times daily.   Yes Historical Provider, MD  sennosides-docusate sodium (SENOKOT-S) 8.6-50 MG tablet Take 1 tablet by mouth 2 (two) times daily.   Yes Historical Provider, MD  spironolactone (ALDACTONE) 25  MG tablet Take 25 mg by mouth daily.   Yes Historical Provider, MD  terazosin (HYTRIN) 10 MG capsule Take 20 mg by mouth at bedtime.   Yes Historical Provider, MD    Physical Exam: Filed Vitals:   10/02/13 0015 10/02/13 0030 10/02/13 0045 10/02/13 0100  BP: 170/75 161/81 150/76 153/69  Pulse: 96 92 91 88  Temp:      TempSrc:      Resp: 23 24 21 23   SpO2: 99%  99% 100% 98%    General: Alert, Awake and Oriented to Time, Place and Person. Appear in moderate distress Eyes: PERRL ENT: Oral Mucosa clear moist. Neck: no JVD Cardiovascular: S1 and S2 Present, no Murmur, Peripheral Pulses Present Respiratory: Bilateral Air entry equal and Decreased, Basal Crackles,no wheezes Abdomen: Bowel Sound Present, Soft and Right upper quadrant tender Skin: no Rash Extremities: no Pedal edema, no calf tenderness Neurologic: Grossly Unremarkable.  Labs on Admission:  CBC:  Recent Labs Lab 10/06/2013 1812  WBC 6.9  HGB 8.4*  HCT 26.5*  MCV 94.0  PLT 199    CMP     Component Value Date/Time   NA 139 10/21/2013 1812   K 5.4* 10/21/2013 1812   CL 103 10/08/2013 1812   CO2 20 10/09/2013 1812   GLUCOSE 158* 10/14/2013 1812   BUN 32* 10/29/2013 1812   CREATININE 1.63* 10/28/2013 1812   CALCIUM 10.2 10/22/2013 1812   PROT 8.0 10/23/2013 1812   ALBUMIN 2.9* 10/23/2013 1812   AST 107* 10/18/2013 1812   ALT 102* 10/11/2013 1812   ALKPHOS 325* 10/26/2013 1812   BILITOT 1.6* 10/15/2013 1812   GFRNONAA 39* 10/10/2013 1812   GFRAA 46* 10/12/2013 1812     Recent Labs Lab 10/18/2013 1812  LIPASE 37   No results found for this basename: AMMONIA,  in the last 168 hours  No results found for this basename: CKTOTAL, CKMB, CKMBINDEX, TROPONINI,  in the last 168 hours BNP (last 3 results) No results found for this basename: PROBNP,  in the last 8760 hours  Radiological Exams on Admission: Dg Chest 2 View  10/14/2013   CLINICAL DATA:  Shortness of breath. Weakness. Multiple myeloma. Prostate carcinoma.  EXAM: CHEST  2 VIEW  COMPARISON:  None.  FINDINGS: Low lung volumes are noted. Mild opacity seen at the right lung base which may be due to atelectasis or infiltrate. Left lung appears clear. No evidence of pleural effusion.  IMPRESSION: Low lung volumes.  Mild right basilar atelectasis versus infiltrate.   Electronically Signed   By: Gilbert Deleon M.D.   On: 10/24/2013 18:44   Ct Angio  Chest Pe W/cm &/or Wo Cm  10/02/2013   CLINICAL DATA:  Shortness of breath. Elevated D-dimer. Clinical suspicion for pulmonary embolism. History of prostate carcinoma and multiple myeloma.  EXAM: CT ANGIOGRAPHY CHEST WITH CONTRAST  TECHNIQUE: Multidetector CT imaging of the chest was performed using the standard protocol during bolus administration of intravenous contrast. Multiplanar CT image reconstructions including MIPs were obtained to evaluate the vascular anatomy.  CONTRAST:  115m OMNIPAQUE IOHEXOL 350 MG/ML SOLN  COMPARISON:  None.  FINDINGS: Satisfactory opacification of pulmonary arteries noted, and no pulmonary emboli identified. No evidence of thoracic aortic dissection or aneurysm. No evidence of mediastinal hematoma or mass.  No lymphadenopathy identified within the thorax. Tiny pleural effusions versus pleural thickening noted bilaterally. Mild cardiomegaly noted. No evidence of pulmonary infiltrate or mass.  Images obtained through the upper abdomen show heterogeneous attenuation of the liver,  which may be due to the presence of multiple hypovascular liver metastases.  Review of the MIP images confirms the above findings.  IMPRESSION: No evidence of pulmonary embolism.  Mild cardiomegaly and tiny bilateral pleural effusions versus pleural thickening.  Heterogeneous appearance of liver noted, and multiple hypovascular metastases cannot be excluded. Recommend further imaging characterization with nonemergent abdomen MRI without and with contrast as the preferred exam.   Electronically Signed   By: Gilbert Deleon M.D.   On: 10/02/2013 00:07   US Abdomen Complete  10/23/2013   CLINICAL DATA:  Upper abdominal pain and shortness of breath. History multiple myeloma and prostate cancer.  EXAM: ULTRASOUND ABDOMEN COMPLETE  COMPARISON:  None.  FINDINGS: Gallbladder:  No evidence of cholelithiasis. Mild gallbladder wall thickening measuring 4.8 mm. Positive sonographic Murphy's sign.  Common bile duct:   Diameter: 6.7 to 8.1 mm.  Liver:  Heterogeneous echotexture with several rounded hypoechoic rimmed masses.  IVC:  No abnormality visualized.  Pancreas:  Visualized portion unremarkable.  Spleen:  Within normal in size with a 4.9 cm area of mild increased echogenicity as cannot exclude a mass.  Right Kidney:  Length: 11.9 cm. Two adjacent rounded hypo to anechoic masses over the upper pole with mild increased through transmission likely cysts. These measure 1.5 and 2.8 cm for respectively.  Left Kidney:  Length: 11.6 cm. Echogenicity within normal limits. No mass or hydronephrosis visualized.  Abdominal aorta:  3.3 cm in AP diameter proximally.  Other findings:  None.  IMPRESSION: Heterogeneous echotexture to the liver with several rounded hypoechoic rimmed masses which may represent metastatic disease. 4.9 cm hyperechoic region of the spleen as cannot exclude a mass. Recommend CT of the abdomen/ pelvis for further evaluation.  No evidence of cholelithiasis. Mild gallbladder wall thickening measuring 4.8 mm with positive sonographic Murphy sign per recommend clinical correlation.  Two adjacent cysts over the superior pole of the right kidney measuring 1.5 and 2.8 cm respectively.  Minimal aneurysmal dilatation of the proximal abdominal aorta measuring 3.3 cm in AP diameter.   Electronically Signed   By: Marin Olp M.D.   On: 10/17/2013 22:08     Assessment/Plan Principal Problem:   Acute cholecystitis Active Problems:   Multiple myeloma   Prostate cancer   Anemia   Renal failure   Liver mass   Splenic mass   Hypertension   1. Acute cholecystitis The patient is presenting with complaints of cough and shortness of breath. His shortness of breath has been progressively worsening on exertion since last 2 months and acutely got worse this morning associated with dizziness and tachycardia.  His cough has been ongoing since last one week. He has undergone a CT chest NG tube to rule out pulmonary  embolism which was negative for any PE. A CT chest did show that he has mild Cardiomegaly and possible atelectasis. He also undergone an ultrasound of the abdomen due to his complaint of right upper quadrant pain on exam and worsened LFT which did show possible acute cholecystitis. Surgery has been constulted. Due to his complaint of cough and shortness of breath as well as acute cholecystitis he was treated broadly with IV vancomycin and IV Zosyn due to his history of multiple myeloma.  2.Liver mass as well as a splenic mass His CT scan of the chest as well as ultrasound of the abdomen does show heterogenous attenuation in the liver and spleen. For which an MRI is recommended for further workup. At present the patient mentions that  he is claustrophobic and can only go for an MRI with an open setup. I recommended him sedation and an MRI which he thinks would not be feasible for him. Further Workup based on surgical consult Which is done for acute cholecystitis.  3.Renal failure Likely secondary to multiple myeloma. The patient has received contrast for CT chest therefore I would avoid CT abdomen and pelvis with contrast which is required for liver evaluation. Continue to monitor his BMP. Continue gentle hydration. Avoid nephrotoxic medication.  4.Accelerated hypertension Continue his home antihypertensive medication and when necessary hydralazine.  Consults: General surgeon  DVT Prophylaxis: subcutaneous Heparin Nutrition: N.p.o.  Code Status: Full  Disposition: Admitted to inpatient in telemetry unit.  Author: Berle Mull, MD [facility] Pager: [phone] 10/02/2013, 2:33 AM    If 7PM-7AM, please contact night-coverage [email or web address] Password [email or web address]

## 2013-10-02 NOTE — Progress Notes (Signed)
Received report from Shirlean Mylar, [place] in ED.

## 2013-10-02 NOTE — Progress Notes (Signed)
79yo male to add Zosyn to ABX regimen for acute cholecystitis.  Will start Zosyn 3.375g IV Q8H and monitor CBC, Cx.  Wynona Neat, PharmD, BCPS 10/02/2013 6:18 AM

## 2013-10-02 NOTE — Progress Notes (Signed)
Patient ID: Gilbert Deleon, male   DOB: Jan 13, 1935, 79 y.o.   MRN: [id number]    Subjective: Patient states he has a history of prostate cancer and multiple myeloma.  He states he takes a shot every 6 months for his prostate cancer and receives some type of medicine for his myeloma, but denies chemo for either one.  These are all done at the [facility].  Patient states he has had abdominal pain periumbilical and going to the right side for 1-2 months.  His pain may have been slightly worse, but he came to the ED last night due to persistent pain for the last couple of months more than anything.  Objective: Vital signs in last 24 hours: Temp:  [98 F (36.7 C)-100.3 F (37.9 C)] 98.5 F (36.9 C) (01/04 0600) Pulse Rate:  [43-126] 81 (01/04 0600) Resp:  [0-31] 20 (01/04 0600) BP: (107-170)/(61-120) 143/74 mmHg (01/04 0600) SpO2:  [98 %-100 %] 98 % (01/04 0600) Weight:  [280 lb (127.007 kg)-280 lb 11.2 oz (127.325 kg)] 280 lb 11.2 oz (127.325 kg) (01/04 0600) Last BM Date: 09/28/13  Intake/Output from previous day:   Intake/Output this shift:    PE: Abd: soft, minimally tender on the right side of his abdomen, +BS, ND Heart: regular Lungs: CTAN  Lab Results:   Recent Labs  10/24/2013 1812 10/02/13 0840  WBC 6.9 6.5  HGB 8.4* 7.1*  HCT 26.5* 21.7*  PLT 199 153   BMET  Recent Labs  10/03/2013 1812  NA 139  K 5.4*  CL 103  CO2 20  GLUCOSE 158*  BUN 32*  CREATININE 1.63*  CALCIUM 10.2   PT/INR  Recent Labs  10/02/13 0840  LABPROT 16.3*  INR 1.34   CMP     Component Value Date/Time   NA 139 10/02/2013 1812   K 5.4* 10/23/2013 1812   CL 103 10/21/2013 1812   CO2 20 10/22/2013 1812   GLUCOSE 158* 10/12/2013 1812   BUN 32* 10/14/2013 1812   CREATININE 1.63* 10/09/2013 1812   CALCIUM 10.2 10/08/2013 1812   PROT 8.0 10/04/2013 1812   ALBUMIN 2.9* 10/22/2013 1812   AST 107* 10/25/2013 1812   ALT 102* 10/23/2013 1812   ALKPHOS 325* 10/11/2013 1812   BILITOT 1.6* 09/30/2013 1812   GFRNONAA  39* 10/24/2013 1812   GFRAA 46* 10/17/2013 1812   Lipase     Component Value Date/Time   LIPASE 37 10/16/2013 1812       Studies/Results: Dg Chest 2 View  10/18/2013   CLINICAL DATA:  Shortness of breath. Weakness. Multiple myeloma. Prostate carcinoma.  EXAM: CHEST  2 VIEW  COMPARISON:  None.  FINDINGS: Low lung volumes are noted. Mild opacity seen at the right lung base which may be due to atelectasis or infiltrate. Left lung appears clear. No evidence of pleural effusion.  IMPRESSION: Low lung volumes.  Mild right basilar atelectasis versus infiltrate.   Electronically Signed   By: Earle Gell M.D.   On: 10/26/2013 18:44   Ct Angio Chest Pe W/cm &/or Wo Cm  10/02/2013   CLINICAL DATA:  Shortness of breath. Elevated D-dimer. Clinical suspicion for pulmonary embolism. History of prostate carcinoma and multiple myeloma.  EXAM: CT ANGIOGRAPHY CHEST WITH CONTRAST  TECHNIQUE: Multidetector CT imaging of the chest was performed using the standard protocol during bolus administration of intravenous contrast. Multiplanar CT image reconstructions including MIPs were obtained to evaluate the vascular anatomy.  CONTRAST:  142m OMNIPAQUE IOHEXOL 350 MG/ML SOLN  COMPARISON:  None.  FINDINGS: Satisfactory opacification of pulmonary arteries noted, and no pulmonary emboli identified. No evidence of thoracic aortic dissection or aneurysm. No evidence of mediastinal hematoma or mass.  No lymphadenopathy identified within the thorax. Tiny pleural effusions versus pleural thickening noted bilaterally. Mild cardiomegaly noted. No evidence of pulmonary infiltrate or mass.  Images obtained through the upper abdomen show heterogeneous attenuation of the liver, which may be due to the presence of multiple hypovascular liver metastases.  Review of the MIP images confirms the above findings.  IMPRESSION: No evidence of pulmonary embolism.  Mild cardiomegaly and tiny bilateral pleural effusions versus pleural thickening.   Heterogeneous appearance of liver noted, and multiple hypovascular metastases cannot be excluded. Recommend further imaging characterization with nonemergent abdomen MRI without and with contrast as the preferred exam.   Electronically Signed   By: Earle Gell M.D.   On: 10/02/2013 00:07   US Abdomen Complete  10/06/2013   CLINICAL DATA:  Upper abdominal pain and shortness of breath. History multiple myeloma and prostate cancer.  EXAM: ULTRASOUND ABDOMEN COMPLETE  COMPARISON:  None.  FINDINGS: Gallbladder:  No evidence of cholelithiasis. Mild gallbladder wall thickening measuring 4.8 mm. Positive sonographic Murphy's sign.  Common bile duct:  Diameter: 6.7 to 8.1 mm.  Liver:  Heterogeneous echotexture with several rounded hypoechoic rimmed masses.  IVC:  No abnormality visualized.  Pancreas:  Visualized portion unremarkable.  Spleen:  Within normal in size with a 4.9 cm area of mild increased echogenicity as cannot exclude a mass.  Right Kidney:  Length: 11.9 cm. Two adjacent rounded hypo to anechoic masses over the upper pole with mild increased through transmission likely cysts. These measure 1.5 and 2.8 cm for respectively.  Left Kidney:  Length: 11.6 cm. Echogenicity within normal limits. No mass or hydronephrosis visualized.  Abdominal aorta:  3.3 cm in AP diameter proximally.  Other findings:  None.  IMPRESSION: Heterogeneous echotexture to the liver with several rounded hypoechoic rimmed masses which may represent metastatic disease. 4.9 cm hyperechoic region of the spleen as cannot exclude a mass. Recommend CT of the abdomen/ pelvis for further evaluation.  No evidence of cholelithiasis. Mild gallbladder wall thickening measuring 4.8 mm with positive sonographic Murphy sign per recommend clinical correlation.  Two adjacent cysts over the superior pole of the right kidney measuring 1.5 and 2.8 cm respectively.  Minimal aneurysmal dilatation of the proximal abdominal aorta measuring 3.3 cm in AP diameter.    Electronically Signed   By: Marin Olp M.D.   On: 10/24/2013 22:08    Anti-infectives: Anti-infectives   Start     Dose/Rate Route Frequency Ordered Stop   10/03/13 0000  vancomycin (VANCOCIN) 1,750 mg in sodium chloride 0.9 % 500 mL IVPB     1,750 mg 250 mL/hr over 120 Minutes Intravenous Every 24 hours 10/02/13 0255     10/02/13 0630  piperacillin-tazobactam (ZOSYN) IVPB 3.375 g     3.375 g 12.5 mL/hr over 240 Minutes Intravenous 4 times per day 10/02/13 0621     10/02/13 0300  vancomycin (VANCOCIN) 2,000 mg in sodium chloride 0.9 % 500 mL IVPB     2,000 mg 250 mL/hr over 120 Minutes Intravenous  Once 10/02/13 0255 10/02/13 0552   10/02/13 0030  cefTRIAXone (ROCEPHIN) 1 g in dextrose 5 % 50 mL IVPB     1 g 100 mL/hr over 30 Minutes Intravenous  Once 10/02/13 0017 10/02/13 0115   10/02/13 0030  azithromycin (ZITHROMAX) 500 mg in dextrose 5 %  250 mL IVPB     500 mg 250 mL/hr over 60 Minutes Intravenous  Once 10/02/13 0017 10/02/13 0240   09/30/2013 2345  cefTRIAXone (ROCEPHIN) 1 g in dextrose 5 % 50 mL IVPB  Status:  Discontinued     1 g 100 mL/hr over 30 Minutes Intravenous  Once 10/15/2013 2333 09/29/2013 2355       Assessment/Plan  1. Abdominal pain 2. Liver masses 3. Prostate cancer 4. Multiple myeloma 5. "borderline kidney disease"   Plan: 1. Patient is too claustrophobic to undergo an MRI. A CT scan without contrast is not going to be useful to evaluate these lesions.  (Cr 1.63)  Will have to look at obtaining open MRI of abd as outpatient. 2. We will go ahead and also order a HIDA to determine if along with the liver lesions he also has acalculous cholecystitis or if his pain is coming from metastatic disease in his liver. 3. Will follow.    LOS: 1 day    Ave Scharnhorst E 10/02/2013, 10:32 AM Pager: [phone]

## 2013-10-02 NOTE — Progress Notes (Signed)
Placed patient on Atuto titrate max 20 min 7 with full face mask.  Patient is tolerating well.

## 2013-10-02 NOTE — ED Provider Notes (Signed)
I was available for consultation the completion of this patient's care.  Please see the initial providers for the evaluation details.  Carmin Muskrat, MD 10/02/13 2257

## 2013-10-02 NOTE — Progress Notes (Signed)
Patient ID: Gilbert Deleon, male   DOB: May 22, 1935, 79 y.o.   MRN: [id number] [facility] PROGRESS NOTE  Gilbert Deleon RCV:[id number] DOB: 08-10-1935 DOA: 10/03/2013 PCP: No PCP Per Patient  Brief narrative: 79 y.o. male with multiple myeloma not on any therapy since August 2013, prostate cancer treated with hormonal therapy only, presented to [facility] ED with progressively worsening shortness of breath for the past 2-3 months, mostly with exertion but occasionally present at rest. Over the past several days he explains he has developed productive cough, subjective fevers, chills, generalized abdominal discomfort but worse in RUQ area. He denies N/V/D, no chest pain.   Principal Problem:   Abdominal pain - appreciate surgery input - per surgery Hepatobiliary scan is normal, there is no evidence of acute cholecystitis, and no evidence of gallstones - cholecystectomy not recommended  - Recommend open MRI/MRCP as outpatient to assess liver lesions and biliary tree per surgery  - continue supportive care with analgesia and antiemetics as needed  - will advance die as pt able to tolerate  Active Problems:   Acute respiratory failure - no signs of PNA on CXR - will ask for influenza panel screen - continue broad spectrum ABX - obtain sputum analysis and narrow ABX as clinically indicated    Hyperglycemia - will check A1C   Anemia of chronic disease, with acute blood loss - drop in Hg since admission, ask for FOBT - will transfuse 2 U of PRBC - repeat CBC in AM   Renal failure - acute on chronic - no changes in Cr over 24 hours, repeat BMP in AM - will hold losartan for now until renal function stabilizes    Transaminitis - LFT's trending down   Liver mass and Splenic mass - outpatient MRCP recommended per surgery    Hypertension - reasonable inpatient control    Multiple myeloma   Prostate cancer  Consultants:  Surgery   Procedures/Studies: Dg Chest 2 View   09/30/2013     Low lung  volumes.  Mild right basilar atelectasis versus infiltrate.    Ct Angio Chest Pe W/cm &/or Wo Cm   10/02/2013   No evidence of pulmonary embolism.  Mild cardiomegaly and tiny bilateral pleural effusions versus pleural thickening.  Heterogeneous appearance of liver noted, and multiple hypovascular metastases cannot be excluded.   Nm Hepatobiliary   10/02/2013   The cystic and common bile ducts are patent. There is no evidence of cholecystitis.  Enlarged liver with diffusely heterogeneous activity. Recent CT and ultrasound demonstrate diffuse abnormality of the liver concerning for cirrhosis and possible widespread neoplasm.  US Abdomen Complete   10/09/2013  Heterogeneous echotexture to the liver with several rounded hypoechoic rimmed masses which may represent metastatic disease. 4.9 cm hyperechoic region of the spleen as cannot exclude a mass. Recommend CT of the abdomen/ pelvis for further evaluation.  No evidence of cholelithiasis. Mild gallbladder wall thickening measuring 4.8 mm with positive sonographic Murphy sign per recommend clinical correlation.  Two adjacent cysts over the superior pole of the right kidney measuring 1.5 and 2.8 cm respectively.  Minimal aneurysmal dilatation of the proximal abdominal aorta measuring 3.3 cm in AP diameter.     Antibiotics:  Vancomycin 01/04 -->  Zosyn 01/04 -->  Code Status: Full Family Communication: Pt at bedside Disposition Plan: Home when medically stable  HPI/Subjective: No events overnight.   Objective: Filed Vitals:   10/02/13 0600 10/02/13 1036 10/02/13 1256 10/02/13 1358  BP: 143/74 143/83 140/80 136/83  Pulse: 81  90 80 70  Temp: 98.5 F (36.9 C)   98 F (36.7 C)  TempSrc: Oral   Oral  Resp: 20   20  Height: 6' 3"  (1.905 m)     Weight: 127.325 kg (280 lb 11.2 oz)     SpO2: 98%   99%    Intake/Output Summary (Last 24 hours) at 10/02/13 1513 Last data filed at 10/02/13 1400  Gross per 24 hour  Intake    400 ml  Output    350 ml   Net     50 ml    Exam:   General:  Pt is alert, follows commands appropriately, not in acute distress  Cardiovascular: Regular rate and rhythm, S1/S2, no murmurs, no rubs, no gallops  Respiratory: Clear to auscultation bilaterally, no wheezing, no crackles, no rhonchi  Abdomen: Soft, non tender, non distended, bowel sounds present, no guarding  Data Reviewed: Basic Metabolic Panel:  Recent Labs Lab 10/13/2013 1812 10/02/13 0840  NA 139 138  K 5.4* 4.3  CL 103 105  CO2 20 21  GLUCOSE 158* 119*  BUN 32* 36*  CREATININE 1.63* 1.63*  CALCIUM 10.2 9.5   Liver Function Tests:  Recent Labs Lab 10/11/2013 1812 10/02/13 0840  AST 107* 84*  ALT 102* 83*  ALKPHOS 325* 276*  BILITOT 1.6* 1.3*  PROT 8.0 7.2  ALBUMIN 2.9* 2.5*    Recent Labs Lab 10/18/2013 1812  LIPASE 37   CBC:  Recent Labs Lab 10/15/2013 1812 10/02/13 0840  WBC 6.9 6.5  NEUTROABS  --  4.9  HGB 8.4* 7.1*  HCT 26.5* 21.7*  MCV 94.0 92.7  PLT 199 153   CBG:  Recent Labs Lab 10/21/2013 1730  GLUCAP 156*   Scheduled Meds: . allopurinol  100 mg Oral Daily  . aspirin EC  325 mg Oral Daily  . heparin  5,000 Units Subcutaneous Q8H  . losartan  25 mg Oral Daily  . metoprolol  50 mg Oral BID  . ZOSYN  IV  3.375 g Intravenous Q8H  . senna-docusate  1 tablet Oral BID  . terazosin  20 mg Oral QHS  . vancomycin  1,750 mg Intravenous Q24H   Continuous Infusions: . sodium chloride 50 mL/hr at 10/02/13 2761   Faye Ramsay, MD  [facility] Pager [phone]  If 7PM-7AM, please contact night-coverage [email or web address] Password [email or web address] 10/02/2013, 3:13 PM   LOS: 1 day

## 2013-10-02 NOTE — Progress Notes (Signed)
Pt admitted to unit at 0600. Pt alert, oriented, and pleasant. Skin is intact. Pt oriented to room, call bell within reach. Bed alarm on. Will continue to monitor per md order

## 2013-10-02 NOTE — Progress Notes (Signed)
General surgery attending note:  I have personally interviewed and examined this patient this morning. I agree with the assessment and treatment plan outlined by Gilbert Danker, PA.  Hepatobiliary scan is normal. Gallbladder visualizes. Small bowel visualizes.  There is no evidence of acute cholecystitis, and no evidence of gallstones.Therefore I do not recommend cholecystectomy.  Recommend open MRI/MRCP as outpatient to assess liver lesions and biliary tree.   Gilbert Deleon. Gilbert Deleon, M.D., [facility] General and Minimally invasive Surgery Breast and Colorectal Surgery Office:   [phone]

## 2013-10-02 NOTE — Progress Notes (Signed)
ANTIBIOTIC CONSULT NOTE - INITIAL  Pharmacy Consult for Vancocin Indication: rule out pneumonia  No Known Allergies  Patient Measurements: Height: 6' 3"  (190.5 cm) Weight: 280 lb (127.007 kg) IBW/kg (Calculated) : 84.5  Vital Signs: Temp: 99.8 F (37.7 C) (01/03 2229) Temp src: Oral (01/03 2229) BP: 156/72 mmHg (01/04 0215) Pulse Rate: 88 (01/04 0215)  Labs:  Recent Labs  10/20/2013 1812  WBC 6.9  HGB 8.4*  PLT 199  CREATININE 1.63*   Estimated Creatinine Clearance: 55.4 ml/min (by C-G formula based on Cr of 1.63).   Microbiology: No results found for this or any previous visit (from the past 720 hour(s)).  Medical History: Past Medical History  Diagnosis Date  . Prostate cancer   . Multiple myeloma   . Hypertension     Assessment: 79yo male c/o SOB at night w/ abdominal pain [id number] worsened today, CXR concerning for PNA vs atelectasis, CT negative for PE, to begin IV ABX.  Goal of Therapy:  Vancomycin trough level 15-20 mcg/ml  Plan:  Will give vancomycin 2007mIV x1 now then 175059mV Q24H and monitor CBC, Cx, levels prn.  VerWynona NeatharmD, BCPS  10/02/2013,2:55 AM

## 2013-10-02 NOTE — Consult Note (Signed)
Reason for Consult:upper abdominal pain Referring Physician: Dr Blanchie Dessert  Gilbert Deleon is an 79 y.o. male.  HPI: 19 yom who is somnolent and not very reliable with history right now.  He is falling asleep at times during interview.  He does state he has had some bloating of his stomach for several months. His appetite has also been decreased and states food does not taste good.  He has upper abdominal pain right now but came to hospital for shortness of breath worse with exertion and difficulty with balance.   He does state he has myeloma for which he is treated at [facility].  He states he has not had history of prostate cancer although in his chart.  Past Medical History  Diagnosis Date  . Prostate cancer   . Multiple myeloma   . Hypertension   anemia   PSH appy, IH repair  History reviewed. No pertinent family history.  Social History:  reports that he has quit smoking. He does not have any smokeless tobacco history on file. He reports that he does not drink alcohol or use illicit drugs.  Allergies: No Known Allergies  Medications: I have reviewed the patient's current medications.  Results for orders placed during the hospital encounter of 10/13/2013 (from the past 48 hour(s))  GLUCOSE, CAPILLARY     Status: Abnormal   Collection Time    10/24/2013  5:30 PM      Result Value Range   Glucose-Capillary 156 (*) 70 - 99 mg/dL   Comment 1 Notify RN     Comment 2 Documented in Chart    CBC     Status: Abnormal   Collection Time    10/21/2013  6:12 PM      Result Value Range   WBC 6.9  4.0 - 10.5 K/uL   RBC 2.82 (*) 4.22 - 5.81 MIL/uL   Hemoglobin 8.4 (*) 13.0 - 17.0 g/dL   HCT 26.5 (*) 39.0 - 52.0 %   MCV 94.0  78.0 - 100.0 fL   MCH 29.8  26.0 - 34.0 pg   MCHC 31.7  30.0 - 36.0 g/dL   RDW 18.1 (*) 11.5 - 15.5 %   Platelets 199  150 - 400 K/uL  COMPREHENSIVE METABOLIC PANEL     Status: Abnormal   Collection Time    10/13/2013  6:12 PM      Result Value Range   Sodium  139  137 - 147 mEq/L   Comment: Please note change in reference range.   Potassium 5.4 (*) 3.7 - 5.3 mEq/L   Comment: HEMOLYSIS AT THIS LEVEL MAY AFFECT RESULT     Please note change in reference range.   Chloride 103  96 - 112 mEq/L   CO2 20  19 - 32 mEq/L   Glucose, Bld 158 (*) 70 - 99 mg/dL   BUN 32 (*) 6 - 23 mg/dL   Creatinine, Ser 1.63 (*) 0.50 - 1.35 mg/dL   Calcium 10.2  8.4 - 10.5 mg/dL   Total Protein 8.0  6.0 - 8.3 g/dL   Albumin 2.9 (*) 3.5 - 5.2 g/dL   AST 107 (*) 0 - 37 U/L   ALT 102 (*) 0 - 53 U/L   Alkaline Phosphatase 325 (*) 39 - 117 U/L   Total Bilirubin 1.6 (*) 0.3 - 1.2 mg/dL   GFR calc non Af Amer 39 (*) >90 mL/min   GFR calc Af Amer 46 (*) >90 mL/min   Comment: (NOTE)  The eGFR has been calculated using the CKD EPI equation.     This calculation has not been validated in all clinical situations.     eGFR's persistently <90 mL/min signify possible Chronic Kidney     Disease.  TYPE AND SCREEN     Status: None   Collection Time    10/14/2013  6:12 PM      Result Value Range   ABO/RH(D) B POS     Antibody Screen NEG     Sample Expiration 10/04/2013    LIPASE, BLOOD     Status: None   Collection Time    09/29/2013  6:12 PM      Result Value Range   Lipase 37  11 - 59 U/L  ABO/RH     Status: None   Collection Time    10/16/2013  6:12 PM      Result Value Range   ABO/RH(D) B POS    POCT I-STAT TROPONIN I     Status: None   Collection Time    10/14/2013  6:22 PM      Result Value Range   Troponin i, poc 0.03  0.00 - 0.08 ng/mL   Comment 3            Comment: Due to the release kinetics of cTnI,     a negative result within the first hours     of the onset of symptoms does not rule out     myocardial infarction with certainty.     If myocardial infarction is still suspected,     repeat the test at appropriate intervals.  D-DIMER, QUANTITATIVE     Status: Abnormal   Collection Time    10/18/2013  7:50 PM      Result Value Range   D-Dimer, Quant >20.00  (*) 0.00 - 0.48 ug/mL-FEU   Comment:            AT THE INHOUSE ESTABLISHED CUTOFF     VALUE OF 0.48 ug/mL FEU,     THIS ASSAY HAS BEEN DOCUMENTED     IN THE LITERATURE TO HAVE     A SENSITIVITY AND NEGATIVE     PREDICTIVE VALUE OF AT LEAST     98 TO 99%.  THE TEST RESULT     SHOULD BE CORRELATED WITH     AN ASSESSMENT OF THE CLINICAL     PROBABILITY OF DVT / VTE.     REPEATED TO VERIFY  URINALYSIS, ROUTINE W REFLEX MICROSCOPIC     Status: Abnormal   Collection Time    10/15/2013 10:25 PM      Result Value Range   Color, Urine AMBER (*) YELLOW   Comment: BIOCHEMICALS MAY BE AFFECTED BY COLOR   APPearance CLOUDY (*) CLEAR   Specific Gravity, Urine 1.025  1.005 - 1.030   pH 5.0  5.0 - 8.0   Glucose, UA NEGATIVE  NEGATIVE mg/dL   Hgb urine dipstick NEGATIVE  NEGATIVE   Bilirubin Urine MODERATE (*) NEGATIVE   Ketones, ur 15 (*) NEGATIVE mg/dL   Protein, ur NEGATIVE  NEGATIVE mg/dL   Urobilinogen, UA 1.0  0.0 - 1.0 mg/dL   Nitrite NEGATIVE  NEGATIVE   Leukocytes, UA TRACE (*) NEGATIVE  URINE MICROSCOPIC-ADD ON     Status: Abnormal   Collection Time    10/02/2013 10:25 PM      Result Value Range   Squamous Epithelial / LPF RARE  RARE   WBC, UA 0-2  <3  WBC/hpf   RBC / HPF 0-2  <3 RBC/hpf   Bacteria, UA MANY (*) RARE   Casts HYALINE CASTS (*) NEGATIVE   Comment: GRANULAR CAST   Urine-Other AMORPHOUS URATES/PHOSPHATES    POCT I-STAT TROPONIN I     Status: None   Collection Time    10/02/13  1:03 AM      Result Value Range   Troponin i, poc 0.07  0.00 - 0.08 ng/mL   Comment 3            Comment: Due to the release kinetics of cTnI,     a negative result within the first hours     of the onset of symptoms does not rule out     myocardial infarction with certainty.     If myocardial infarction is still suspected,     repeat the test at appropriate intervals.    Dg Chest 2 View  10/27/2013   CLINICAL DATA:  Shortness of breath. Weakness. Multiple myeloma. Prostate carcinoma.   EXAM: CHEST  2 VIEW  COMPARISON:  None.  FINDINGS: Low lung volumes are noted. Mild opacity seen at the right lung base which may be due to atelectasis or infiltrate. Left lung appears clear. No evidence of pleural effusion.  IMPRESSION: Low lung volumes.  Mild right basilar atelectasis versus infiltrate.   Electronically Signed   By: Earle Gell M.D.   On: 10/14/2013 18:44   Ct Angio Chest Pe W/cm &/or Wo Cm  10/02/2013   CLINICAL DATA:  Shortness of breath. Elevated D-dimer. Clinical suspicion for pulmonary embolism. History of prostate carcinoma and multiple myeloma.  EXAM: CT ANGIOGRAPHY CHEST WITH CONTRAST  TECHNIQUE: Multidetector CT imaging of the chest was performed using the standard protocol during bolus administration of intravenous contrast. Multiplanar CT image reconstructions including MIPs were obtained to evaluate the vascular anatomy.  CONTRAST:  170m OMNIPAQUE IOHEXOL 350 MG/ML SOLN  COMPARISON:  None.  FINDINGS: Satisfactory opacification of pulmonary arteries noted, and no pulmonary emboli identified. No evidence of thoracic aortic dissection or aneurysm. No evidence of mediastinal hematoma or mass.  No lymphadenopathy identified within the thorax. Tiny pleural effusions versus pleural thickening noted bilaterally. Mild cardiomegaly noted. No evidence of pulmonary infiltrate or mass.  Images obtained through the upper abdomen show heterogeneous attenuation of the liver, which may be due to the presence of multiple hypovascular liver metastases.  Review of the MIP images confirms the above findings.  IMPRESSION: No evidence of pulmonary embolism.  Mild cardiomegaly and tiny bilateral pleural effusions versus pleural thickening.  Heterogeneous appearance of liver noted, and multiple hypovascular metastases cannot be excluded. Recommend further imaging characterization with nonemergent abdomen MRI without and with contrast as the preferred exam.   Electronically Signed   By: JEarle GellM.D.    On: 10/02/2013 00:07   UKoreaAbdomen Complete  09/30/2013   CLINICAL DATA:  Upper abdominal pain and shortness of breath. History multiple myeloma and prostate cancer.  EXAM: ULTRASOUND ABDOMEN COMPLETE  COMPARISON:  None.  FINDINGS: Gallbladder:  No evidence of cholelithiasis. Mild gallbladder wall thickening measuring 4.8 mm. Positive sonographic Murphy's sign.  Common bile duct:  Diameter: 6.7 to 8.1 mm.  Liver:  Heterogeneous echotexture with several rounded hypoechoic rimmed masses.  IVC:  No abnormality visualized.  Pancreas:  Visualized portion unremarkable.  Spleen:  Within normal in size with a 4.9 cm area of mild increased echogenicity as cannot exclude a mass.  Right Kidney:  Length: 11.9 cm. Two adjacent  rounded hypo to anechoic masses over the upper pole with mild increased through transmission likely cysts. These measure 1.5 and 2.8 cm for respectively.  Left Kidney:  Length: 11.6 cm. Echogenicity within normal limits. No mass or hydronephrosis visualized.  Abdominal aorta:  3.3 cm in AP diameter proximally.  Other findings:  None.  IMPRESSION: Heterogeneous echotexture to the liver with several rounded hypoechoic rimmed masses which may represent metastatic disease. 4.9 cm hyperechoic region of the spleen as cannot exclude a mass. Recommend CT of the abdomen/ pelvis for further evaluation.  No evidence of cholelithiasis. Mild gallbladder wall thickening measuring 4.8 mm with positive sonographic Murphy sign per recommend clinical correlation.  Two adjacent cysts over the superior pole of the right kidney measuring 1.5 and 2.8 cm respectively.  Minimal aneurysmal dilatation of the proximal abdominal aorta measuring 3.3 cm in AP diameter.   Electronically Signed   By: Marin Olp M.D.   On: 10/05/2013 22:08    Review of Systems  Constitutional: Positive for weight loss and malaise/fatigue. Negative for fever and chills.  Respiratory: Positive for shortness of breath.   Cardiovascular: Negative  for chest pain.  Gastrointestinal: Positive for abdominal pain and constipation. Negative for nausea, vomiting, diarrhea, blood in stool and melena.  Neurological: Positive for sensory change.   Blood pressure 124/63, pulse 90, temperature 99.8 F (37.7 C), temperature source Oral, resp. rate 24, height 6' 3"  (1.905 m), weight 280 lb (127.007 kg), SpO2 99.00%. Physical Exam  Constitutional: He appears well-developed and well-nourished.  HENT:  Head: Normocephalic and atraumatic.  Eyes: No scleral icterus.  Cardiovascular: Normal rate, regular rhythm and normal heart sounds.   Respiratory: Effort normal and breath sounds normal. He has no wheezes. He has no rales.  GI: Soft. Bowel sounds are normal. He exhibits no distension. There is tenderness (mild tenderness to palpation) in the right upper quadrant and epigastric area. No hernia.  Lymphadenopathy:    He has no cervical adenopathy.    Assessment/Plan: Abdominal pain  He has upper abdominal pain with US showing no gallstones but does have thickened gb wall with report of positive sonographic murphys sign.  He may very well need cholecystectomy and think it is reasonable to treat with abx.  I do think though that the masses in liver should be evaluated and ct was recommended.  MR recommended after negative ct angio to rule out pe and will need to ask radiology which would prefer.  Also would be good to clarify history a little better and possibly get records from [place].  We will follow along with you.  Doug Bucklin 10/02/2013, 3:48 AM

## 2013-10-03 ENCOUNTER — Inpatient Hospital Stay (HOSPITAL_COMMUNITY): Payer: Medicare Other

## 2013-10-03 DIAGNOSIS — C9000 Multiple myeloma not having achieved remission: Secondary | ICD-10-CM

## 2013-10-03 LAB — CBC
HEMATOCRIT: 25.8 % — AB (ref 39.0–52.0)
Hemoglobin: 8.5 g/dL — ABNORMAL LOW (ref 13.0–17.0)
MCH: 29.5 pg (ref 26.0–34.0)
MCHC: 32.9 g/dL (ref 30.0–36.0)
MCV: 89.6 fL (ref 78.0–100.0)
Platelets: 141 10*3/uL — ABNORMAL LOW (ref 150–400)
RBC: 2.88 MIL/uL — ABNORMAL LOW (ref 4.22–5.81)
RDW: 19.3 % — AB (ref 11.5–15.5)
WBC: 6.4 10*3/uL (ref 4.0–10.5)

## 2013-10-03 LAB — BASIC METABOLIC PANEL
BUN: 35 mg/dL — AB (ref 6–23)
CO2: 20 mEq/L (ref 19–32)
Calcium: 9.8 mg/dL (ref 8.4–10.5)
Chloride: 106 mEq/L (ref 96–112)
Creatinine, Ser: 1.54 mg/dL — ABNORMAL HIGH (ref 0.50–1.35)
GFR, EST AFRICAN AMERICAN: 49 mL/min — AB (ref >90)
GFR, EST NON AFRICAN AMERICAN: 42 mL/min — AB (ref >90)
Glucose, Bld: 103 mg/dL — ABNORMAL HIGH (ref 70–99)
Potassium: 4.3 mEq/L (ref 3.7–5.3)
Sodium: 139 mEq/L (ref 137–147)

## 2013-10-03 LAB — HEMOGLOBIN A1C
Hgb A1c MFr Bld: 5.6 % (ref <5.7)
MEAN PLASMA GLUCOSE: 114 mg/dL (ref <117)

## 2013-10-03 LAB — INFLUENZA PANEL BY PCR (TYPE A & B)
H1N1FLUPCR: NOT DETECTED
INFLBPCR: NEGATIVE
Influenza A By PCR: NEGATIVE

## 2013-10-03 LAB — TYPE AND SCREEN
ABO/RH(D): B POS
Antibody Screen: NEGATIVE
UNIT DIVISION: 0
Unit division: 0

## 2013-10-03 LAB — LEGIONELLA ANTIGEN, URINE: Legionella Antigen, Urine: NEGATIVE

## 2013-10-03 MED ORDER — OSELTAMIVIR PHOSPHATE 75 MG PO CAPS
75.0000 mg | ORAL_CAPSULE | Freq: Two times a day (BID) | ORAL | Status: DC
  Administered 2013-10-03: 10:00:00 75 mg via ORAL
  Filled 2013-10-03 (×2): qty 1

## 2013-10-03 MED ORDER — TECHNETIUM TC 99M DIETHYLENETRIAME-PENTAACETIC ACID: 40.0000 | Freq: Once | INTRAVENOUS | Status: AC | PRN

## 2013-10-03 MED ORDER — TECHNETIUM TO 99M ALBUMIN AGGREGATED
6.0000 | Freq: Once | INTRAVENOUS | Status: AC | PRN
  Administered 2013-10-03: 6 via INTRAVENOUS

## 2013-10-03 NOTE — Progress Notes (Signed)
INITIAL NUTRITION ASSESSMENT  DOCUMENTATION CODES Per approved criteria  -Obesity Unspecified   INTERVENTION: Encouraged adequate intake of foods and beverages. Agree with Regular diet. Declined oral nutrition supplements. RD to continue to follow nutrition care plan.  NUTRITION DIAGNOSIS: Inadequate oral intake related to variable appetite as evidenced by pt report.   Goal: Intake to meet >90% of estimated nutrition needs.  Monitor:  weight trends, lab trends, I/O's, PO intake, supplement tolerance  Reason for Assessment: Malnutrition Screening Tool  79 y.o. male  Admitting Dx: Acute cholecystitis  ASSESSMENT: PMHx significant for multiple myeloma, prostate cancer. Admitted with SOB x 2-3 months and abdominal pain x 1-2 months. Work-up reveals acute cholecystitis. Per surgery team, no surgery warranted at this time.  Nutrition Focused Physical Exam:  Subcutaneous Fat:  Orbital Region: WNL Upper Arm Region: WNL Thoracic and Lumbar Region: WNL  Muscle:  Temple Region: moderate depletion Clavicle Bone Region: WNL Clavicle and Acromion Bone Region: WNL Scapular Bone Region: N/A Dorsal Hand: N/A Patellar Region: WNL Anterior Thigh Region: WNL Posterior Calf Region: WNL  Edema: n/a  Pt reports that his intake has improved than when he was at home, but it is not back to baseline. Eating better at each meal. Doesn't like oral nutrition supplements. Pt unable to tell me how quickly he's been losing weight but stated that before he got sick he was weighing around 315 lb, and then he started hovering around 300 lb, and he is now down to 280 lb. He states that he thinks that he has gone from 300 lb - 280 lb in approximately 6 months. This is a weight change of 7% and is not significant for this time frame.  Pt is at nutrition risk 2/2 recent weight loss and diminished intake.  Height: Ht Readings from Last 1 Encounters:  10/02/13 6' 3"  (1.905 m)    Weight: Wt Readings  from Last 1 Encounters:  10/02/13 280 lb 11.2 oz (127.325 kg)    Ideal Body Weight: 196 lb  % Ideal Body Weight: 143%  Wt Readings from Last 10 Encounters:  10/02/13 280 lb 11.2 oz (127.325 kg)    Usual Body Weight: 300 lb (6 months ago, per 0t)  % Usual Body Weight: 93%  BMI:  Body mass index is 35.09 kg/(m^2). Obese Class II  Estimated Nutritional Needs: Kcal: 2050 - 2200 Protein: 90 - 100 g Fluid: approx 2 - 2.2 liters daily  Skin: intact  Diet Order: General  EDUCATION NEEDS: -No education needs identified at this time   Intake/Output Summary (Last 24 hours) at 10/03/13 1217 Last data filed at 10/03/13 1000  Gross per 24 hour  Intake 2824.58 ml  Output    350 ml  Net 2474.58 ml    Last BM: PTA  Labs:   Recent Labs Lab 10/12/2013 1812 10/02/13 0830 10/02/13 0840 10/03/13 0632  NA 139  --  138 139  K 5.4*  --  4.3 4.3  CL 103  --  105 106  CO2 20  --  21 20  BUN 32*  --  36* 35*  CREATININE 1.63*  --  1.63* 1.54*  CALCIUM 10.2  --  9.5 9.8  MG  --  2.2  --   --   GLUCOSE 158*  --  119* 103*    CBG (last 3)   Recent Labs  10/27/2013 1730  GLUCAP 156*    Scheduled Meds: . allopurinol  100 mg Oral Daily  . antiseptic oral rinse  15 mL Mouth Rinse BID  . aspirin EC  325 mg Oral Daily  . heparin  5,000 Units Subcutaneous Q8H  . metoprolol  50 mg Oral BID  . oseltamivir  75 mg Oral BID  . piperacillin-tazobactam (ZOSYN)  IV  3.375 g Intravenous Q8H  . senna-docusate  1 tablet Oral BID  . terazosin  20 mg Oral QHS  . vancomycin  1,750 mg Intravenous Q24H    Continuous Infusions: . sodium chloride 50 mL/hr at 10/02/13 2333    Past Medical History  Diagnosis Date  . Prostate cancer   . Multiple myeloma   . Hypertension     History reviewed. No pertinent past surgical history.  Inda Coke MS, RD, LDN Pager: [phone] After-hours pager: [phone]

## 2013-10-03 NOTE — Progress Notes (Signed)
[facility] PROGRESS NOTE  Rohail Klees WUJ:[id number] DOB: [birth year] DOA: 10/14/2013 PCP: No PCP Per Patient  Brief narrative: 79 year old male with history of multiple myeloma not on any therapy since August 2013, prostate cancer treated with hormonal therapy only, presented to [facility] ED 10/28/2013 with progressively worsening shortness of breath for the past 2-3 months, mostly with exertion but occasionally present at rest. Pt also reported having developed productive cough, subjective fevers, chills, abdominal discomfort n RUQ area. His evaluation in ED and so far has included CT angio which ruled out PE but it did show possible liver mets. HIDA scan was negative for acute cholecystitis. US abdomen showed  several rounded hypoechoic rimmed masses which may represent metastatic disease.  Assessment and Plan:  Principal Problem:  Abdominal pain  - based on negative HIDA scan less likely acute cholecystitis - pain may be due to liver lesion concerning for metastases; will talk to oncology in am for an input on further management.  Perhaps may need biopsy of one of those liver lesion. - Surgery recommended open MRI/MRCP as outpatient to assess liver lesions and biliary tree  - continue supportive care with analgesia and antiemetics as needed  - diet as tolerated Active Problems:  Acute respiratory failure  - no signs of PNA on CXR  - influenza negative; d/c tamiflu - continue broad spectrum ABX  - prelim blood culture show no growth to date  Abnormal liver function tests - likely due to metastatic liver disease - AST/ ALT/ ALP 107/ 102/ 325 and bilirubin 1.6 - already trending down  Hyperglycemia  - normal A1  Anemia of chronic disease, with acute blood loss  - transfused 2 units since admission, 2 units PRBC - follow up CBC in am Acute renal failure  - acute on chronic  - will hold losartan for now until renal function stabilizes  Hypertension  - reasonable inpatient control     Code Status: Full  Family Communication: Pt at bedside  Disposition Plan: Home when medically stable   Leisa Lenz, MD  [facility] Pager [phone]  If 7PM-7AM, please contact night-coverage [email or web address] Password [email or web address] 10/03/2013, 8:59 PM   LOS: 2 days   Consultants:  Surgery   Procedures/Studies:  Dg Chest 2 View 10/03/2013  Low lung volumes. Mild right basilar atelectasis versus infiltrate.  Ct Angio Chest Pe W/cm &/or Wo Cm 10/02/2013  No evidence of pulmonary embolism. Mild cardiomegaly and tiny bilateral pleural effusions versus pleural thickening. Heterogeneous appearance of liver noted, and multiple hypovascular metastases cannot be excluded.  Nm Hepatobiliary 10/02/2013  The cystic and common bile ducts are patent. There is no evidence of cholecystitis. Enlarged liver with diffusely heterogeneous activity. Recent CT and ultrasound demonstrate diffuse abnormality of the liver concerning for cirrhosis and possible widespread neoplasm.  US Abdomen Complete 09/29/2013  Heterogeneous echotexture to the liver with several rounded hypoechoic rimmed masses which may represent metastatic disease. 4.9 cm hyperechoic region of the spleen as cannot exclude a mass. Recommend CT of the abdomen/ pelvis for further evaluation. No evidence of cholelithiasis. Mild gallbladder wall thickening measuring 4.8 mm with positive sonographic Murphy sign per recommend clinical correlation. Two adjacent cysts over the superior pole of the right kidney measuring 1.5 and 2.8 cm respectively. Minimal aneurysmal dilatation of the proximal abdominal aorta measuring 3.3 cm in AP diameter.   Antibiotics:  Vanco 10/28/2013 -->  Zosyn 10/17/2013 -->  HPI/Subjective: Says he feels better this am.   Objective: Filed Vitals:   10/03/13  8413 10/03/13 0959 10/03/13 1130 10/03/13 1432  BP: 128/72 179/87 163/79 162/80  Pulse: 74 76  63  Temp: 99.1 F (37.3 C)   97.4 F (36.3 C)  TempSrc: Axillary    Axillary  Resp: 20     Height:      Weight:      SpO2: 100%   100%    Intake/Output Summary (Last 24 hours) at 10/03/13 2059 Last data filed at 10/03/13 1800  Gross per 24 hour  Intake 2205.83 ml  Output      0 ml  Net 2205.83 ml    Exam:   General:  Pt is alert, follows commands appropriately, not in acute distress  Cardiovascular: Regular rate and rhythm, S1/S2 appreciated  Respiratory: Clear to auscultation bilaterally, no wheezing, no crackles, no rhonchi  Abdomen: Soft, non tender, non distended, bowel sounds present, no guarding  Extremities: No edema, pulses DP and PT palpable bilaterally  Neuro: Grossly nonfocal  Data Reviewed: Basic Metabolic Panel:  Recent Labs Lab 10/09/2013 1812 10/02/13 0830 10/02/13 0840 10/03/13 0632  NA 139  --  138 139  K 5.4*  --  4.3 4.3  CL 103  --  105 106  CO2 20  --  21 20  GLUCOSE 158*  --  119* 103*  BUN 32*  --  36* 35*  CREATININE 1.63*  --  1.63* 1.54*  CALCIUM 10.2  --  9.5 9.8  MG  --  2.2  --   --    Liver Function Tests:  Recent Labs Lab 10/29/2013 1812 10/02/13 0840  AST 107* 84*  ALT 102* 83*  ALKPHOS 325* 276*  BILITOT 1.6* 1.3*  PROT 8.0 7.2  ALBUMIN 2.9* 2.5*    Recent Labs Lab 10/14/2013 1812  LIPASE 37   No results found for this basename: AMMONIA,  in the last 168 hours CBC:  Recent Labs Lab 10/08/2013 1812 10/02/13 0840 10/03/13 0632  WBC 6.9 6.5 6.4  NEUTROABS  --  4.9  --   HGB 8.4* 7.1* 8.5*  HCT 26.5* 21.7* 25.8*  MCV 94.0 92.7 89.6  PLT 199 153 141*   Cardiac Enzymes: No results found for this basename: CKTOTAL, CKMB, CKMBINDEX, TROPONINI,  in the last 168 hours BNP: No components found with this basename: POCBNP,  CBG:  Recent Labs Lab 10/20/2013 1730  GLUCAP 156*    CULTURE, BLOOD (ROUTINE X 2)     Status: None   Collection Time    10/02/13  8:40 AM      Result Value Range Status   Specimen Description BLOOD LEFT ARM   Final   Value:        BLOOD CULTURE RECEIVED  NO GROWTH TO DATE CULTURE WILL BE HELD FOR 5 DAYS BEFORE ISSUING A FINAL NEGATIVE REPORT     Performed at [facility]   Report Status PENDING   Incomplete  CULTURE, BLOOD (ROUTINE X 2)     Status: None   Collection Time    10/02/13  8:45 AM      Result Value Range Status   Specimen Description BLOOD LEFT HAND   Final   Value:        BLOOD CULTURE RECEIVED NO GROWTH TO DATE CULTURE WILL BE HELD FOR 5 DAYS BEFORE ISSUING A FINAL NEGATIVE REPORT     Performed at [facility]   Report Status PENDING   Incomplete     Studies: Ct Angio Chest Pe W/cm &/or Wo Cm  10/02/2013   IMPRESSION: No evidence of pulmonary embolism.  Mild cardiomegaly and tiny bilateral pleural effusions versus pleural thickening.  Heterogeneous appearance of liver noted, and multiple hypovascular metastases cannot be excluded. Recommend further imaging characterization with nonemergent abdomen MRI without and with contrast as the preferred exam.   Nm Hepatobiliary 10/02/2013     IMPRESSION: 1. The cystic and common bile ducts are patent. There is no evidence of cholecystitis. 2. Enlarged liver with diffusely heterogeneous activity. Recent CT and ultrasound demonstrate diffuse abnormality of the liver concerning for cirrhosis and possible widespread neoplasm. Further evaluation is recommended, ideally with abdominal MRI without and with contrast.   Electronically Signed   By: Camie Patience M.D.   On: 10/02/2013 13:03   US Abdomen Complete 09/30/2013     IMPRESSION: Heterogeneous echotexture to the liver with several rounded hypoechoic rimmed masses which may represent metastatic disease. 4.9 cm hyperechoic region of the spleen as cannot exclude a mass. Recommend CT of the abdomen/ pelvis for further evaluation.  No evidence of cholelithiasis. Mild gallbladder wall thickening measuring 4.8 mm with positive sonographic Murphy sign per recommend clinical correlation.  Two adjacent cysts over the superior pole of the right  kidney measuring 1.5 and 2.8 cm respectively.  Minimal aneurysmal dilatation of the proximal abdominal aorta measuring 3.3 cm in AP diameter.     Nm Pulmonary Perf And Vent 10/03/2013    IMPRESSION: No appreciable ventilation or perfusion defects. Very low probability of pulmonary embolus. Cardiomegaly is noted.   Electronically Signed   By: Lowella Grip M.D.   On: 10/03/2013 17:22    Scheduled Meds: . allopurinol  100 mg Oral Daily  . aspirin EC  325 mg Oral Daily  . metoprolol  50 mg Oral BID  . piperacillin-tazobactam  3.375 g Intravenous Q8H  . terazosin  20 mg Oral QHS  . vancomycin  1,750 mg Intravenous Q24H   Continuous Infusions: . sodium chloride 50 mL/hr at 10/03/13 1756

## 2013-10-03 NOTE — Progress Notes (Signed)
Placed patient on CPAP at 7cm for the night

## 2013-10-03 NOTE — Progress Notes (Signed)
Subjective: Pt doing well.  abd pain better. Tol PO  Objective: Vital signs in last 24 hours: Temp:  [97.1 F (36.2 C)-99.1 F (37.3 C)] 99.1 F (37.3 C) (01/05 0453) Pulse Rate:  [68-90] 74 (01/05 0453) Resp:  [16-20] 20 (01/05 0453) BP: (116-156)/(72-88) 128/72 mmHg (01/05 0453) SpO2:  [97 %-100 %] 100 % (01/05 0453) Last BM Date:  (PTA)  Intake/Output from previous day: 01/04 0701 - 01/05 0700 In: 2435.4 [P.O.:250; I.V.:933.3; Blood:602.1; IV Piggyback:650] Out: 350 [Urine:350] Intake/Output this shift:    General appearance: alert and cooperative GI: soft, non-tender; bowel sounds normal; no masses,  no organomegaly  Lab Results:   Recent Labs  10/02/13 0840 10/03/13 0632  WBC 6.5 6.4  HGB 7.1* 8.5*  HCT 21.7* 25.8*  PLT 153 141*   BMET  Recent Labs  10/02/13 0840 10/03/13 0632  NA 138 139  K 4.3 4.3  CL 105 106  CO2 21 20  GLUCOSE 119* 103*  BUN 36* 35*  CREATININE 1.63* 1.54*  CALCIUM 9.5 9.8   PT/INR  Recent Labs  10/02/13 0840  LABPROT 16.3*  INR 1.34   ABG No results found for this basename: PHART, PCO2, PO2, HCO3,  in the last 72 hours  Studies/Results: Dg Chest 2 View  10/22/2013   CLINICAL DATA:  Shortness of breath. Weakness. Multiple myeloma. Prostate carcinoma.  EXAM: CHEST  2 VIEW  COMPARISON:  None.  FINDINGS: Low lung volumes are noted. Mild opacity seen at the right lung base which may be due to atelectasis or infiltrate. Left lung appears clear. No evidence of pleural effusion.  IMPRESSION: Low lung volumes.  Mild right basilar atelectasis versus infiltrate.   Electronically Signed   By: Earle Gell M.D.   On: 10/29/2013 18:44   Ct Angio Chest Pe W/cm &/or Wo Cm  10/02/2013   CLINICAL DATA:  Shortness of breath. Elevated D-dimer. Clinical suspicion for pulmonary embolism. History of prostate carcinoma and multiple myeloma.  EXAM: CT ANGIOGRAPHY CHEST WITH CONTRAST  TECHNIQUE: Multidetector CT imaging of the chest was performed  using the standard protocol during bolus administration of intravenous contrast. Multiplanar CT image reconstructions including MIPs were obtained to evaluate the vascular anatomy.  CONTRAST:  169m OMNIPAQUE IOHEXOL 350 MG/ML SOLN  COMPARISON:  None.  FINDINGS: Satisfactory opacification of pulmonary arteries noted, and no pulmonary emboli identified. No evidence of thoracic aortic dissection or aneurysm. No evidence of mediastinal hematoma or mass.  No lymphadenopathy identified within the thorax. Tiny pleural effusions versus pleural thickening noted bilaterally. Mild cardiomegaly noted. No evidence of pulmonary infiltrate or mass.  Images obtained through the upper abdomen show heterogeneous attenuation of the liver, which may be due to the presence of multiple hypovascular liver metastases.  Review of the MIP images confirms the above findings.  IMPRESSION: No evidence of pulmonary embolism.  Mild cardiomegaly and tiny bilateral pleural effusions versus pleural thickening.  Heterogeneous appearance of liver noted, and multiple hypovascular metastases cannot be excluded. Recommend further imaging characterization with nonemergent abdomen MRI without and with contrast as the preferred exam.   Electronically Signed   By: JEarle GellM.D.   On: 10/02/2013 00:07   Nm Hepatobiliary  10/02/2013   CLINICAL DATA:  Right upper quadrant abdominal pain. Question cholecystitis.  EXAM: NUCLEAR MEDICINE HEPATOBILIARY IMAGING  TECHNIQUE: Sequential images of the abdomen were obtained out to 60 minutes following intravenous administration of radiopharmaceutical.  COMPARISON:  Chest CT 10/06/2013.  Abdominal ultrasound 10/12/2013.  RADIOPHARMACEUTICALS:  5.047m Tc-9960m  Choletec  FINDINGS: The initial images demonstrate hepatomegaly with diffusely heterogeneous hepatic activity corresponding with the suspected underlying masses on recent studies. Delayed imaging does demonstrate accumulation of activity within the gallbladder.  There is progressive small bowel activity.  IMPRESSION: 1. The cystic and common bile ducts are patent. There is no evidence of cholecystitis. 2. Enlarged liver with diffusely heterogeneous activity. Recent CT and ultrasound demonstrate diffuse abnormality of the liver concerning for cirrhosis and possible widespread neoplasm. Further evaluation is recommended, ideally with abdominal MRI without and with contrast.   Electronically Signed   By: Camie Patience M.D.   On: 10/02/2013 13:03   US Abdomen Complete  10/17/2013   CLINICAL DATA:  Upper abdominal pain and shortness of breath. History multiple myeloma and prostate cancer.  EXAM: ULTRASOUND ABDOMEN COMPLETE  COMPARISON:  None.  FINDINGS: Gallbladder:  No evidence of cholelithiasis. Mild gallbladder wall thickening measuring 4.8 mm. Positive sonographic Murphy's sign.  Common bile duct:  Diameter: 6.7 to 8.1 mm.  Liver:  Heterogeneous echotexture with several rounded hypoechoic rimmed masses.  IVC:  No abnormality visualized.  Pancreas:  Visualized portion unremarkable.  Spleen:  Within normal in size with a 4.9 cm area of mild increased echogenicity as cannot exclude a mass.  Right Kidney:  Length: 11.9 cm. Two adjacent rounded hypo to anechoic masses over the upper pole with mild increased through transmission likely cysts. These measure 1.5 and 2.8 cm for respectively.  Left Kidney:  Length: 11.6 cm. Echogenicity within normal limits. No mass or hydronephrosis visualized.  Abdominal aorta:  3.3 cm in AP diameter proximally.  Other findings:  None.  IMPRESSION: Heterogeneous echotexture to the liver with several rounded hypoechoic rimmed masses which may represent metastatic disease. 4.9 cm hyperechoic region of the spleen as cannot exclude a mass. Recommend CT of the abdomen/ pelvis for further evaluation.  No evidence of cholelithiasis. Mild gallbladder wall thickening measuring 4.8 mm with positive sonographic Murphy sign per recommend clinical correlation.   Two adjacent cysts over the superior pole of the right kidney measuring 1.5 and 2.8 cm respectively.  Minimal aneurysmal dilatation of the proximal abdominal aorta measuring 3.3 cm in AP diameter.   Electronically Signed   By: Marin Olp M.D.   On: 10/28/2013 22:08    Anti-infectives: Anti-infectives   Start     Dose/Rate Route Frequency Ordered Stop   10/03/13 1000  oseltamivir (TAMIFLU) capsule 75 mg     75 mg Oral 2 times daily 10/03/13 0843 10/08/13 0959   10/03/13 0000  vancomycin (VANCOCIN) 1,750 mg in sodium chloride 0.9 % 500 mL IVPB     1,750 mg 250 mL/hr over 120 Minutes Intravenous Every 24 hours 10/02/13 0255     10/02/13 2200  piperacillin-tazobactam (ZOSYN) IVPB 3.375 g     3.375 g 12.5 mL/hr over 240 Minutes Intravenous 3 times per day 10/02/13 1347     10/02/13 0630  piperacillin-tazobactam (ZOSYN) IVPB 3.375 g  Status:  Discontinued     3.375 g 12.5 mL/hr over 240 Minutes Intravenous 4 times per day 10/02/13 0621 10/02/13 1347   10/02/13 0300  vancomycin (VANCOCIN) 2,000 mg in sodium chloride 0.9 % 500 mL IVPB     2,000 mg 250 mL/hr over 120 Minutes Intravenous  Once 10/02/13 0255 10/02/13 0552   10/02/13 0030  cefTRIAXone (ROCEPHIN) 1 g in dextrose 5 % 50 mL IVPB     1 g 100 mL/hr over 30 Minutes Intravenous  Once 10/02/13 0017 10/02/13 0115  10/02/13 0030  azithromycin (ZITHROMAX) 500 mg in dextrose 5 % 250 mL IVPB     500 mg 250 mL/hr over 60 Minutes Intravenous  Once 10/02/13 0017 10/02/13 0240   10/20/2013 2345  cefTRIAXone (ROCEPHIN) 1 g in dextrose 5 % 50 mL IVPB  Status:  Discontinued     1 g 100 mL/hr over 30 Minutes Intravenous  Once 10/10/2013 2333 10/14/2013 2355      Assessment/Plan: s/p * No surgery found * HIDA scan normal - no surgery needed. [facility]  LOS: 2 days    Rosario Jacks., [facility] 10/03/2013

## 2013-10-03 NOTE — Progress Notes (Signed)
UR completed. Shelitha Magley RN CCM Case Mgmt 

## 2013-10-04 DIAGNOSIS — R109 Unspecified abdominal pain: Secondary | ICD-10-CM | POA: Diagnosis present

## 2013-10-04 LAB — BASIC METABOLIC PANEL
BUN: 25 mg/dL — ABNORMAL HIGH (ref 6–23)
CALCIUM: 9.8 mg/dL (ref 8.4–10.5)
CO2: 22 mEq/L (ref 19–32)
CREATININE: 1.29 mg/dL (ref 0.50–1.35)
Chloride: 105 mEq/L (ref 96–112)
GFR calc Af Amer: 60 mL/min — ABNORMAL LOW (ref >90)
GFR, EST NON AFRICAN AMERICAN: 52 mL/min — AB (ref >90)
GLUCOSE: 91 mg/dL (ref 70–99)
Potassium: 4.5 mEq/L (ref 3.7–5.3)
Sodium: 138 mEq/L (ref 137–147)

## 2013-10-04 LAB — CBC WITH DIFFERENTIAL/PLATELET
BASOS ABS: 0 10*3/uL (ref 0.0–0.1)
Basophils Relative: 0 % (ref 0–1)
Eosinophils Absolute: 0 10*3/uL (ref 0.0–0.7)
Eosinophils Relative: 1 % (ref 0–5)
HCT: 24.6 % — ABNORMAL LOW (ref 39.0–52.0)
Hemoglobin: 8.2 g/dL — ABNORMAL LOW (ref 13.0–17.0)
Lymphocytes Relative: 16 % (ref 12–46)
Lymphs Abs: 0.9 10*3/uL (ref 0.7–4.0)
MCH: 29.8 pg (ref 26.0–34.0)
MCHC: 33.3 g/dL (ref 30.0–36.0)
MCV: 89.5 fL (ref 78.0–100.0)
Monocytes Absolute: 0.5 10*3/uL (ref 0.1–1.0)
Monocytes Relative: 10 % (ref 3–12)
NEUTROS PCT: 74 % (ref 43–77)
Neutro Abs: 4.1 10*3/uL (ref 1.7–7.7)
PLATELETS: 125 10*3/uL — AB (ref 150–400)
RBC: 2.75 MIL/uL — AB (ref 4.22–5.81)
RDW: 18.7 % — AB (ref 11.5–15.5)
WBC: 5.9 10*3/uL (ref 4.0–10.5)

## 2013-10-04 LAB — HEPATIC FUNCTION PANEL
ALBUMIN: 2.4 g/dL — AB (ref 3.5–5.2)
ALT: 94 U/L — ABNORMAL HIGH (ref 0–53)
AST: 130 U/L — ABNORMAL HIGH (ref 0–37)
Alkaline Phosphatase: 282 U/L — ABNORMAL HIGH (ref 39–117)
BILIRUBIN INDIRECT: 1 mg/dL — AB (ref 0.3–0.9)
Bilirubin, Direct: 0.9 mg/dL — ABNORMAL HIGH (ref 0.0–0.3)
Total Bilirubin: 1.9 mg/dL — ABNORMAL HIGH (ref 0.3–1.2)
Total Protein: 6.9 g/dL (ref 6.0–8.3)

## 2013-10-04 MED ORDER — METOPROLOL TARTRATE 50 MG PO TABS
75.0000 mg | ORAL_TABLET | Freq: Two times a day (BID) | ORAL | Status: DC
  Administered 2013-10-04 – 2013-10-16 (×24): 75 mg via ORAL
  Filled 2013-10-04 (×32): qty 1

## 2013-10-04 NOTE — Clinical Social Work Placement (Signed)
Clinical Social Work Department CLINICAL SOCIAL WORK PLACEMENT NOTE 10/04/2013  Patient:  Gilbert, Deleon  Account Number:  [id number] [place] date:  10/10/2013  Clinical Social Worker:  Kemper Durie, [place]  Date/time:  10/04/2013 03:00 PM  Clinical Social Work is seeking post-discharge placement for this patient at the following level of care:   [place]   (*CSW will update this form in Epic as items are completed)     Patient/family provided with [place] Department of Clinical Social Work's list of facilities offering this level of care within the geographic area requested by the patient (or if unable, by the patient's family).    Patient/family informed of their freedom to choose among providers that offer the needed level of care, that participate in Medicare, Medicaid or managed care program needed by the patient, have an available bed and are willing to accept the patient.    Patient/family informed of [facility]' ownership interest in [facility], as well as of the fact that they are under no obligation to receive care at this facility.  PASARR submitted to EDS on  PASARR number received from [place] on   FL2 transmitted to all facilities in geographic area requested by pt/family on  10/04/2013 FL2 transmitted to all facilities within larger geographic area on   Patient informed that his/her managed care company has contracts with or will negotiate with  certain facilities, including the following:     Patient/family informed of bed offers received:   Patient chooses bed at  Physician recommends and patient chooses bed at    Patient to be transferred to  on   Patient to be transferred to facility by   The following physician request were entered in Epic:   Additional Comments: Patient is requesting placement in [facility] in [place] area).   Liz Beach, [place], [place], [phone]

## 2013-10-04 NOTE — Clinical Social Work Psychosocial (Signed)
Clinical Social Work Department BRIEF PSYCHOSOCIAL ASSESSMENT 10/04/2013  Patient:  Gilbert Deleon, Gilbert Deleon     Account Number:  [id number]     Admit date:  10/20/2013  Clinical Social Worker:  Lovey Newcomer  Date/Time:  10/04/2013 02:30 PM  Referred by:  Physician  Date Referred:  10/04/2013 Referred for  SNF Placement   Other Referral:   Interview type:  Patient Other interview type:   Patient alert and oriented at time of assessment.    PSYCHOSOCIAL DATA Living Status:  ALONE Admitted from facility:   Level of care:   Primary support name:  Christean Grief Primary support relationship to patient:  SIBLING Degree of support available:   Patient has good support. Patient has brother, but also reports having niece and nephew in [place].    CURRENT CONCERNS Current Concerns  Post-Acute Placement   Other Concerns:    SOCIAL WORK ASSESSMENT / PLAN CSW met with patient at DC to discuss recommendation for SNF. Patient agreeable to SNF placement in [facility] in [place]. Patient would like [facility]. CSW explained SNF search/placement process. Patient states that he lives alone in [place], [place] but has brother and a niece and nephew for support. Patient plans to return home after SNF. CSW will follow.   Assessment/plan status:  Psychosocial Support/Ongoing Assessment of Needs Other assessment/ plan:   Complete FL2, Fax   Information/referral to community resources:   CSW contact information given to patient.    PATIENT'S/FAMILY'S RESPONSE TO PLAN OF CARE: Patient agreeable to SNF placement in [facility] in [place]. Patient was pleasant, appropriate, and appreciative of CSW contact. Patient waits for bed offers. CSW will follow.       Liz Beach, [place], [place], [phone]

## 2013-10-04 NOTE — Clinical Social Work Note (Signed)
CSW assessed patient for SNF. Patient agreeable to SNF placement in [place], [place] where is from. CSW currently unable to submit assessment through [facility]. Complete assessment will be entered when able. CSW will follow.  Liz Beach, [place], [place], [phone]

## 2013-10-04 NOTE — Progress Notes (Signed)
[facility] PROGRESS NOTE  Gilbert Deleon PFY:[id number] DOB: [birth year] DOA: 10/20/2013 PCP: No PCP Per Patient  Assessment/Plan: #1 abdominal pain Questionable etiology. May be likely secondary to liver lesions that are concerning for metastases. Had a scan was negative for acute cholecystitis. Patient hasn't been seen by general surgery and that if patient has acute cholecystitis. Surgery recommended an open MRI/MRCP as outpatient for further evaluation of liver lesions the biliary tree. Continue supportive care, anti-emetics, analgesia. Will discontinue IV antibiotics.  #2 acute respiratory distress Questionable etiology. Chest x-ray was negative for pneumonia. Influenza PCR was negative. Patient with clinical improvement. Blood cultures were no growth to date. VQ scan negative for PE. Will discontinue empiric IV antibiotics. Follow clinically.  #3 abnormal LFTs Likely secondary to probable metastatic liver disease. Follow LFTs. Patient will likely need MRI MRCP open as outpatient for further evaluation. Will likely need to followup at the [facility] for further workup of this.  #4 anemia of chronic disease H&H stable. Follow.  #5 acute renal failure Likely secondary to prerenal azotemia. ARB on hold. Renal function improvement. Continue hydration. Will follow.  #6 hypertension Increase Lopressor to 75 mg twice daily. Continue Hytrin. Follow.  #7 prophylaxis SCDs for DVT prophylaxis.  Code Status: Full Family Communication: Updated patient no family at bedside. Disposition Plan: Skilled nursing facility when medically stable.   Consultants:  General surgery: Dr. Donne Hazel 10/02/2013  Procedures:  Chest x-ray 09/29/2013  Abdominal ultrasound 10/03/2013  CT angiogram of the chest 10/04/2013   HIDA Scan 10/02/2013  VQ scan 10/03/2013  Antibiotics:  IV azithromycin 10/02/2013 >>> 10/02/2013  IV Rocephin 10/02/2013>>>> 10/02/2013  IV Zosyn 10/02/2013>>>>  10/04/2013  IV vancomycin 10/02/2013>>>> 10/04/2013  HPI/Subjective: Patient denies any abdominal pain. Patient states she's feeling better. No complaints.  Objective: Filed Vitals:   10/04/13 2110  BP: 158/90  Pulse: 75  Temp: 98.6 F (37 C)  Resp: 18    Intake/Output Summary (Last 24 hours) at 10/04/13 2118 Last data filed at 10/04/13 1640  Gross per 24 hour  Intake 1733.33 ml  Output    600 ml  Net 1133.33 ml   Filed Weights   10/02/13 0252 10/02/13 0600  Weight: 127.007 kg (280 lb) 127.325 kg (280 lb 11.2 oz)    Exam:   General:  NAD  Cardiovascular: RRR  Respiratory: CTAB  Abdomen: Obese, soft, nontender, nondistended, positive bowel sounds.  Musculoskeletal: No clubbing cyanosis or edema.  Data Reviewed: Basic Metabolic Panel:  Recent Labs Lab 10/24/2013 1812 10/02/13 0830 10/02/13 0840 10/03/13 0632 10/04/13 0944  NA 139  --  138 139 138  K 5.4*  --  4.3 4.3 4.5  CL 103  --  105 106 105  CO2 20  --  21 20 22   GLUCOSE 158*  --  119* 103* 91  BUN 32*  --  36* 35* 25*  CREATININE 1.63*  --  1.63* 1.54* 1.29  CALCIUM 10.2  --  9.5 9.8 9.8  MG  --  2.2  --   --   --    Liver Function Tests:  Recent Labs Lab 10/24/2013 1812 10/02/13 0840 10/04/13 0944  AST 107* 84* 130*  ALT 102* 83* 94*  ALKPHOS 325* 276* 282*  BILITOT 1.6* 1.3* 1.9*  PROT 8.0 7.2 6.9  ALBUMIN 2.9* 2.5* 2.4*    Recent Labs Lab 10/21/2013 1812  LIPASE 37   No results found for this basename: AMMONIA,  in the last 168 hours CBC:  Recent Labs Lab 09/30/2013  1812 10/02/13 0840 10/03/13 0632 10/04/13 0944  WBC 6.9 6.5 6.4 5.9  NEUTROABS  --  4.9  --  4.1  HGB 8.4* 7.1* 8.5* 8.2*  HCT 26.5* 21.7* 25.8* 24.6*  MCV 94.0 92.7 89.6 89.5  PLT 199 153 141* 125*   Cardiac Enzymes: No results found for this basename: CKTOTAL, CKMB, CKMBINDEX, TROPONINI,  in the last 168 hours BNP (last 3 results) No results found for this basename: PROBNP,  in the last 8760  hours CBG:  Recent Labs Lab 09/29/2013 1730  GLUCAP 156*    Recent Results (from the past 240 hour(s))  CULTURE, BLOOD (ROUTINE X 2)     Status: None   Collection Time    10/02/13  8:40 AM      Result Value Range Status   Specimen Description BLOOD LEFT ARM   Final   Special Requests BOTTLES DRAWN AEROBIC ONLY 10CC   Final   Culture  Setup Time     Final   Value: 10/02/2013 16:12     Performed at [facility]   Culture     Final   Value:        BLOOD CULTURE RECEIVED NO GROWTH TO DATE CULTURE WILL BE HELD FOR 5 DAYS BEFORE ISSUING A FINAL NEGATIVE REPORT     Performed at [facility]   Report Status PENDING   Incomplete  CULTURE, BLOOD (ROUTINE X 2)     Status: None   Collection Time    10/02/13  8:45 AM      Result Value Range Status   Specimen Description BLOOD LEFT HAND   Final   Special Requests BOTTLES DRAWN AEROBIC ONLY 10CC   Final   Culture  Setup Time     Final   Value: 10/02/2013 16:13     Performed at [facility]   Culture     Final   Value:        BLOOD CULTURE RECEIVED NO GROWTH TO DATE CULTURE WILL BE HELD FOR 5 DAYS BEFORE ISSUING A FINAL NEGATIVE REPORT     Performed at [facility]   Report Status PENDING   Incomplete     Studies: Nm Pulmonary Perf And Vent  10/03/2013   CLINICAL DATA:  Shortness of Breath  EXAM: NUCLEAR MEDICINE VENTILATION - PERFUSION LUNG SCAN  Views: Anterior, posterior, left lateral, right lateral, RPO, LPO, RAO, LAO-ventilation and perfusion  Radionuclide: Technetium 39mDTPA -ventilation; Technetium 979macroaggregated albumin -perfusion  Dose:  40.0 mCi-ventilation; 6.0 mCi-perfusion  Route of administration: Inhalation-ventilation; intravenous-perfusion  COMPARISON:  Chest radiograph October 01, 2013  FINDINGS: Heart is noted to be enlarged. On the ventilation study, there is no focal defect. Uptake of radiotracer is essentially homogeneous and symmetric bilaterally on the ventilation study.  On the  perfusion study, uptake appears homogeneous and symmetric bilaterally without appreciable defect.  There is no appreciable ventilation/perfusion mismatch.  IMPRESSION: No appreciable ventilation or perfusion defects. Very low probability of pulmonary embolus. Cardiomegaly is noted.   Electronically Signed   By: WiLowella Grip.D.   On: 10/03/2013 17:22    Scheduled Meds: . allopurinol  100 mg Oral Daily  . antiseptic oral rinse  15 mL Mouth Rinse BID  . aspirin EC  325 mg Oral Daily  . metoprolol  75 mg Oral BID  . piperacillin-tazobactam (ZOSYN)  IV  3.375 g Intravenous Q8H  . senna-docusate  1 tablet Oral BID  . terazosin  20 mg Oral QHS  .  vancomycin  1,750 mg Intravenous Q24H   Continuous Infusions: . sodium chloride 1,000 mL (10/04/13 1716)    Principal Problem:   Abdominal pain, unspecified site Active Problems:   Multiple myeloma   Prostate cancer   Anemia   Renal failure   Liver mass   Splenic mass   Hypertension    Time spent: 29 minutes    Haruki Arnold M.D. [facility] Pager [phone]. If 7PM-7AM, please contact night-coverage at [email or web address], password [email or web address] 10/04/2013, 9:18 PM  LOS: 3 days

## 2013-10-04 NOTE — Evaluation (Signed)
Physical Therapy Evaluation Patient Details Name: Gilbert Deleon MRN: [id number] DOB: 06-Feb-1935 Today's Date: 10/04/2013 Time: 0932-6712 PT Time Calculation (min): 73 min  PT Assessment / Plan / Recommendation History of Present Illness  79 year old male with history of multiple myeloma not on any therapy since August 2013, prostate cancer treated with hormonal therapy only, presented to [facility] ED 09/30/2013 with progressively worsening shortness of breath for the past 2-3 months, mostly with exertion but occasionally present at rest. Pt also reported having developed productive cough, subjective fevers, chills, abdominal discomfort n RUQ area. His evaluation in ED and so far has included CT angio which ruled out PE but it did show possible liver mets. HIDA scan was negative for acute cholecystitis. US abdomen showed  several rounded hypoechoic rimmed masses which may represent metastatic disease.  Clinical Impression  Pt admitted with above. Pt currently with functional limitations due to the deficits listed below (see PT Problem List).  Pt will benefit from skilled PT to increase their independence and safety with mobility to allow discharge to SNF prior to return home due to limited home support and pt very weak.      PT Assessment  Patient needs continued PT services    Follow Up Recommendations  SNF    Does the patient have the potential to tolerate intense rehabilitation      Barriers to Discharge Decreased caregiver support      Equipment Recommendations  Rolling walker with 5" wheels    Recommendations for Other Services OT consult   Frequency Min 3X/week    Precautions / Restrictions Precautions Precautions: Fall Restrictions Weight Bearing Restrictions: No   Pertinent Vitals/Pain VSS      Mobility  Bed Mobility Bed Mobility: Supine to Sit;Sitting - Scoot to Edge of Bed Supine to Sit: 3: Mod assist Sitting - Scoot to Edge of Bed: 4: Min assist Details for Bed Mobility  Assistance: Assist to bring trunk up. Transfers Transfers: Sit to Stand;Stand to Sit;Stand Pivot Transfers Sit to Stand: 1: +2 Total assist Sit to Stand: Patient Percentage: 60% Stand to Sit: 1: +2 Total assist Stand to Sit: Patient Percentage: 60% Stand Pivot Transfers: 1: +2 Total assist Stand Pivot Transfers: Patient Percentage: 60% Details for Transfer Assistance: Assist to bring hips up. Pt stands with hips flexed and leaning forward onto walker.    Exercises     PT Diagnosis: Difficulty walking;Generalized weakness  PT Problem List: Decreased strength;Decreased activity tolerance;Decreased balance;Decreased mobility;Decreased knowledge of use of DME PT Treatment Interventions: DME instruction;Gait training;Functional mobility training;Therapeutic activities;Therapeutic exercise;Balance training;Patient/family education     PT Goals(Current goals can be found in the care plan section) Acute Rehab PT Goals Patient Stated Goal: Get stronger PT Goal Formulation: With patient Time For Goal Achievement: 10/11/13 Potential to Achieve Goals: Good  Visit Information  Last PT Received On: 10/04/13 Assistance Needed: +2 History of Present Illness: 79 year old male with history of multiple myeloma not on any therapy since August 2013, prostate cancer treated with hormonal therapy only, presented to [facility] ED 10/04/2013 with progressively worsening shortness of breath for the past 2-3 months, mostly with exertion but occasionally present at rest. Pt also reported having developed productive cough, subjective fevers, chills, abdominal discomfort n RUQ area. His evaluation in ED and so far has included CT angio which ruled out PE but it did show possible liver mets. HIDA scan was negative for acute cholecystitis. US abdomen showed  several rounded hypoechoic rimmed masses which may represent metastatic  disease.       Prior [place] expects to be discharged to::  Private residence Living Arrangements: Alone Available Help at Discharge: Family;Available PRN/intermittently (Nephew helps him bathe at times.) Type of Home: House Home Access: Stairs to enter [employer] of Steps: 4 Entrance Stairs-Rails: Right Home Layout: Two level;Bed/bath upstairs;Full bath on main level Alternate Level Stairs-Number of Steps: flight Alternate Level Stairs-Rails: Right Home Equipment: Cane - single point;Toilet riser;Shower seat Prior Function Level of Independence: Needs assistance Gait / Transfers Assistance Needed: Amb independently with two straight canes ADL's / Homemaking Assistance Needed: Nephew assist with bathing at times. Comments: Drives.    Cognition  Cognition Arousal/Alertness: Awake/alert Behavior During Therapy: WFL for tasks assessed/performed Overall Cognitive Status: Within Functional Limits for tasks assessed    Extremity/Trunk Assessment Upper Extremity Assessment Upper Extremity Assessment: Generalized weakness Lower Extremity Assessment Lower Extremity Assessment: Generalized weakness   Balance Balance Balance Assessed: Yes Static Standing Balance Static Standing - Balance Support: Bilateral upper extremity supported;During functional activity Static Standing - Level of Assistance: 4: Min assist Static Standing - Comment/# of Minutes: Pt stood with walker, flexed at hips and leaning heavily on walker with hands/forearms (at times) for 2 minutes while being cleaned up.  End of Session PT - End of Session Activity Tolerance: Patient limited by fatigue Patient left: in chair;with call bell/phone within reach Nurse Communication: Mobility status  GP     [facility] 10/04/2013, 11:49 AM  Suanne Marker PT [phone]

## 2013-10-04 NOTE — Care Management Note (Unsigned)
    Page 1 of 2   10/18/2013     5:12:34 PM   CARE MANAGEMENT NOTE 10/18/2013  Patient:  Gilbert Deleon, Gilbert Deleon   Account Number:  [id number]  Date Initiated:  10/03/2013  Documentation initiated by:  [facility]  Subjective/Objective Assessment:   multiple myeloma, abd pain, cough, shortness of breath     Action/Plan:   pt eval- rec snf.   Anticipated DC Date:  10/18/2013   Anticipated DC Plan:  [place]  In-house referral  Clinical Social Worker      DC Planning Services  CM consult      Choice offered to / List presented to:             Status of service:  In process, will continue to follow Medicare Important Message given?   (If response is "NO", the following Medicare IM given date fields will be blank) Date Medicare IM given:   Date Additional Medicare IM given:    Discharge Disposition:    Per UR Regulation:  Reviewed for med. necessity/level of care/duration of stay  If discussed at [place] of Stay Meetings, dates discussed:   10/06/2013  10/11/2013  10/18/2013    Comments:  1/120/15 17;11 Tomi Bamberger RN, BSN [phone] [facility]. CSW following.  10/12/13 Tomi Bamberger RN, BSN [phone] Oncologist recs hospice for patient, palliative consulted and will scheduled family meeting.  [facility] still does not have a bed yet.  10/11/13 17:49 Tomi Bamberger RN, BSN [phone] patient with mucinous adenocarcinoma, s/p liver bx on 1/8, patient with infiltrate, bld cx's obtained. Plan is for transfer to [facility]  Patient's estranged wife has now contacted CSW and making suggestions for patient to be transferred to [facility].  Per CSW , wife will be here on 1/14.  10/06/13 14:42 Tomi Bamberger RN, BSN [phone] patient for u/ s guided bx today, for possible dc tomorrow to snf.  10/04/13 16:03 Tomi Bamberger RN, BSN [phone] patient lives alone, per phsycal therapy recs snf, CSW referral.

## 2013-10-05 ENCOUNTER — Encounter (HOSPITAL_COMMUNITY): Payer: Self-pay | Admitting: Radiology

## 2013-10-05 DIAGNOSIS — R5383 Other fatigue: Secondary | ICD-10-CM

## 2013-10-05 DIAGNOSIS — R5381 Other malaise: Secondary | ICD-10-CM

## 2013-10-05 LAB — COMPREHENSIVE METABOLIC PANEL
ALBUMIN: 2.5 g/dL — AB (ref 3.5–5.2)
ALT: 109 U/L — AB (ref 0–53)
AST: 153 U/L — ABNORMAL HIGH (ref 0–37)
Alkaline Phosphatase: 306 U/L — ABNORMAL HIGH (ref 39–117)
BILIRUBIN TOTAL: 1.8 mg/dL — AB (ref 0.3–1.2)
BUN: 25 mg/dL — ABNORMAL HIGH (ref 6–23)
CO2: 21 meq/L (ref 19–32)
Calcium: 9.9 mg/dL (ref 8.4–10.5)
Chloride: 106 mEq/L (ref 96–112)
Creatinine, Ser: 1.24 mg/dL (ref 0.50–1.35)
GFR calc Af Amer: 63 mL/min — ABNORMAL LOW (ref >90)
GFR, EST NON AFRICAN AMERICAN: 55 mL/min — AB (ref >90)
Glucose, Bld: 103 mg/dL — ABNORMAL HIGH (ref 70–99)
Potassium: 4.2 mEq/L (ref 3.7–5.3)
SODIUM: 139 meq/L (ref 137–147)
Total Protein: 7.3 g/dL (ref 6.0–8.3)

## 2013-10-05 LAB — CBC
HCT: 25.1 % — ABNORMAL LOW (ref 39.0–52.0)
HEMOGLOBIN: 8.1 g/dL — AB (ref 13.0–17.0)
MCH: 29.3 pg (ref 26.0–34.0)
MCHC: 32.3 g/dL (ref 30.0–36.0)
MCV: 90.9 fL (ref 78.0–100.0)
PLATELETS: 116 10*3/uL — AB (ref 150–400)
RBC: 2.76 MIL/uL — ABNORMAL LOW (ref 4.22–5.81)
RDW: 18.7 % — ABNORMAL HIGH (ref 11.5–15.5)
WBC: 6.1 10*3/uL (ref 4.0–10.5)

## 2013-10-05 LAB — VANCOMYCIN, TROUGH: Vancomycin Tr: 18.1 ug/mL (ref 10.0–20.0)

## 2013-10-05 NOTE — H&P (Signed)
Gilbert Deleon is an 79 y.o. male.   Chief Complaint: Abd pain/SOB worsening x 2 months Abn liver functions Work up reveals liver lesions Pt with hx prostate ca; multiple myeloma Scheduled for liver lesion biopsy  HPI: MM; prostate ca; renal failure; liver lesions; HTN; abd pain; cholecystitis  Past Medical History  Diagnosis Date  . Prostate cancer   . Multiple myeloma   . Hypertension     History reviewed. No pertinent past surgical history.  History reviewed. No pertinent family history. Social History:  reports that he has quit smoking. He does not have any smokeless tobacco history on file. He reports that he does not drink alcohol or use illicit drugs.  Allergies: No Known Allergies  Medications Prior to Admission  Medication Sig Dispense Refill  . allopurinol (ZYLOPRIM) 100 MG tablet Take 100 mg by mouth daily.      Marland Kitchen aspirin EC 325 MG tablet Take 325 mg by mouth daily.      . Calcium Carbonate-Vitamin D (CALCIUM 600+D) 600-400 MG-UNIT per tablet Take 1 tablet by mouth daily.      . Glucosamine-Chondroitin (GLUCOSAMINE CHONDR COMPLEX PO) Take 1 tablet by mouth daily.      Marland Kitchen latanoprost (XALATAN) 0.005 % ophthalmic solution Place 1 drop into both eyes at bedtime.      Marland Kitchen losartan (COZAAR) 25 MG tablet Take 25 mg by mouth daily.      . metoprolol (LOPRESSOR) 50 MG tablet Take 50 mg by mouth 2 (two) times daily.      Marland Kitchen Petrolatum-Zinc Oxide (SENSI-CARE PROTECTIVE BARRIER EX) Apply 1 application topically 2 (two) times daily.      . sennosides-docusate sodium (SENOKOT-S) 8.6-50 MG tablet Take 1 tablet by mouth 2 (two) times daily.      Marland Kitchen spironolactone (ALDACTONE) 25 MG tablet Take 25 mg by mouth daily.      Marland Kitchen terazosin (HYTRIN) 10 MG capsule Take 20 mg by mouth at bedtime.        Results for orders placed during the hospital encounter of 10/14/2013 (from the past 48 hour(s))  BASIC METABOLIC PANEL     Status: Abnormal   Collection Time    10/04/13  9:44 AM      Result Value  Range   Sodium 138  137 - 147 mEq/L   Potassium 4.5  3.7 - 5.3 mEq/L   Chloride 105  96 - 112 mEq/L   CO2 22  19 - 32 mEq/L   Glucose, Bld 91  70 - 99 mg/dL   BUN 25 (*) 6 - 23 mg/dL   Creatinine, Ser 1.29  0.50 - 1.35 mg/dL   Calcium 9.8  8.4 - 10.5 mg/dL   GFR calc non Af Amer 52 (*) >90 mL/min   GFR calc Af Amer 60 (*) >90 mL/min   Comment: (NOTE)     The eGFR has been calculated using the CKD EPI equation.     This calculation has not been validated in all clinical situations.     eGFR's persistently <90 mL/min signify possible Chronic Kidney     Disease.  CBC WITH DIFFERENTIAL     Status: Abnormal   Collection Time    10/04/13  9:44 AM      Result Value Range   WBC 5.9  4.0 - 10.5 K/uL   RBC 2.75 (*) 4.22 - 5.81 MIL/uL   Hemoglobin 8.2 (*) 13.0 - 17.0 g/dL   HCT 24.6 (*) 39.0 - 52.0 %   MCV 89.5  78.0 - 100.0 fL   MCH 29.8  26.0 - 34.0 pg   MCHC 33.3  30.0 - 36.0 g/dL   RDW 18.7 (*) 11.5 - 15.5 %   Platelets 125 (*) 150 - 400 K/uL   Neutrophils Relative % 74  43 - 77 %   Neutro Abs 4.1  1.7 - 7.7 K/uL   Lymphocytes Relative 16  12 - 46 %   Lymphs Abs 0.9  0.7 - 4.0 K/uL   Monocytes Relative 10  3 - 12 %   Monocytes Absolute 0.5  0.1 - 1.0 K/uL   Eosinophils Relative 1  0 - 5 %   Eosinophils Absolute 0.0  0.0 - 0.7 K/uL   Basophils Relative 0  0 - 1 %   Basophils Absolute 0.0  0.0 - 0.1 K/uL  HEPATIC FUNCTION PANEL     Status: Abnormal   Collection Time    10/04/13  9:44 AM      Result Value Range   Total Protein 6.9  6.0 - 8.3 g/dL   Albumin 2.4 (*) 3.5 - 5.2 g/dL   AST 130 (*) 0 - 37 U/L   ALT 94 (*) 0 - 53 U/L   Alkaline Phosphatase 282 (*) 39 - 117 U/L   Total Bilirubin 1.9 (*) 0.3 - 1.2 mg/dL   Bilirubin, Direct 0.9 (*) 0.0 - 0.3 mg/dL   Indirect Bilirubin 1.0 (*) 0.3 - 0.9 mg/dL  VANCOMYCIN, TROUGH     Status: None   Collection Time    10/04/13 11:30 PM      Result Value Range   Vancomycin Tr 18.1  10.0 - 20.0 ug/mL  COMPREHENSIVE METABOLIC PANEL      Status: Abnormal   Collection Time    10/05/13  5:12 AM      Result Value Range   Sodium 139  137 - 147 mEq/L   Potassium 4.2  3.7 - 5.3 mEq/L   Chloride 106  96 - 112 mEq/L   CO2 21  19 - 32 mEq/L   Glucose, Bld 103 (*) 70 - 99 mg/dL   BUN 25 (*) 6 - 23 mg/dL   Creatinine, Ser 1.24  0.50 - 1.35 mg/dL   Calcium 9.9  8.4 - 10.5 mg/dL   Total Protein 7.3  6.0 - 8.3 g/dL   Albumin 2.5 (*) 3.5 - 5.2 g/dL   AST 153 (*) 0 - 37 U/L   ALT 109 (*) 0 - 53 U/L   Alkaline Phosphatase 306 (*) 39 - 117 U/L   Total Bilirubin 1.8 (*) 0.3 - 1.2 mg/dL   GFR calc non Af Amer 55 (*) >90 mL/min   GFR calc Af Amer 63 (*) >90 mL/min   Comment: (NOTE)     The eGFR has been calculated using the CKD EPI equation.     This calculation has not been validated in all clinical situations.     eGFR's persistently <90 mL/min signify possible Chronic Kidney     Disease.  CBC     Status: Abnormal   Collection Time    10/05/13  5:12 AM      Result Value Range   WBC 6.1  4.0 - 10.5 K/uL   RBC 2.76 (*) 4.22 - 5.81 MIL/uL   Hemoglobin 8.1 (*) 13.0 - 17.0 g/dL   HCT 25.1 (*) 39.0 - 52.0 %   MCV 90.9  78.0 - 100.0 fL   MCH 29.3  26.0 - 34.0 pg   MCHC 32.3  30.0 - 36.0 g/dL   RDW 18.7 (*) 11.5 - 15.5 %   Platelets 116 (*) 150 - 400 K/uL   Comment: CONSISTENT WITH PREVIOUS RESULT   Nm Pulmonary Perf And Vent  10/03/2013   CLINICAL DATA:  Shortness of Breath  EXAM: NUCLEAR MEDICINE VENTILATION - PERFUSION LUNG SCAN  Views: Anterior, posterior, left lateral, right lateral, RPO, LPO, RAO, LAO-ventilation and perfusion  Radionuclide: Technetium 104mDTPA -ventilation; Technetium 945macroaggregated albumin -perfusion  Dose:  40.0 mCi-ventilation; 6.0 mCi-perfusion  Route of administration: Inhalation-ventilation; intravenous-perfusion  COMPARISON:  Chest radiograph October 01, 2013  FINDINGS: Heart is noted to be enlarged. On the ventilation study, there is no focal defect. Uptake of radiotracer is essentially homogeneous  and symmetric bilaterally on the ventilation study.  On the perfusion study, uptake appears homogeneous and symmetric bilaterally without appreciable defect.  There is no appreciable ventilation/perfusion mismatch.  IMPRESSION: No appreciable ventilation or perfusion defects. Very low probability of pulmonary embolus. Cardiomegaly is noted.   Electronically Signed   By: WiLowella Grip.D.   On: 10/03/2013 17:22    Review of Systems  Constitutional: Positive for weight loss. Negative for fever.  Respiratory: Negative for shortness of breath.   Cardiovascular: Negative for chest pain.  Gastrointestinal: Positive for abdominal pain. Negative for nausea and vomiting.  Neurological: Positive for weakness.    Blood pressure 170/75, pulse 69, temperature 98.8 F (37.1 C), temperature source Oral, resp. rate 16, height _0  (1.905 m), weight 280 lb 11.2 oz (127.325 kg), SpO2 96.00%. Physical Exam  Constitutional: He is oriented to person, place, and time. He appears well-developed.  Cardiovascular: Normal rate and regular rhythm.   No murmur heard. Respiratory: Effort normal and breath sounds normal. He has no wheezes.  GI: Soft. Bowel sounds are normal. There is tenderness.  Musculoskeletal: Normal range of motion.  Neurological: He is alert and oriented to person, place, and time.  MILDLY CONFUSED AWARE OF NAME AND DOB CANNOT TELL ME PLACE OR YR  Skin: Skin is warm and dry.  Psychiatric: He has a normal mood and affect. His behavior is normal.     Assessment/Plan Liver lesions/ abd pain Request for liver lesion bx 1/8 Pt aware of procedure yet mildly confused Call into brother but no answer Will leave message to call IR - or will try brother again We will call for pt in am  Chatham Howington A 10/05/2013, 1:45 PM

## 2013-10-05 NOTE — Progress Notes (Signed)
[facility] PROGRESS NOTE  Gilbert Deleon IFO:[id number] DOB: [birth year] DOA: 10/13/2013 PCP: No PCP Per Patient  Assessment/Plan: #1 abdominal pain Questionable etiology. May be likely secondary to liver lesions that are concerning for metastases. Had a scan was negative for acute cholecystitis. Patient hasn't been seen by general surgery and that if patient has acute cholecystitis. Surgery recommended an open MRI/MRCP as outpatient for further evaluation of liver lesions the biliary tree. Continue supportive care, anti-emetics, analgesia. -IV antibiotics DC on 1/6. -Further pain at this time, follow.  #2 acute respiratory distress Questionable etiology. Chest x-ray was negative for pneumonia. Influenza PCR was negative. Patient with clinical improvement. Blood cultures were no growth to date. VQ scan negative for PE. Will discontinue empiric IV antibiotics. Follow clinically.  #3 abnormal LFTs Likely secondary to probable metastatic liver disease. Patient will likely need MRI MRCP open as outpatient for further evaluation.  -LFTs  trending up today -I  have consulted IR for possible ultrasound-guided biopsy of liver lesions, will follow. #4 anemia of chronic disease H&H stable. Follow.  #5 acute renal failure Likely secondary to prerenal azotemia. ARB on hold. Renal function improvement. Continue hydration. Will follow.  #6 hypertension Continue Lopressor 75 mg twice daily. Continue Hytrin. Follow.  #7 prophylaxis SCDs for DVT prophylaxis.  Code Status: Full Family Communication: Updated patient no family at bedside. Disposition Plan: Skilled nursing facility when medically stable.   Consultants:  General surgery: Dr. Donne Hazel 10/02/2013  Procedures:  Chest x-ray 10/22/2013  Abdominal ultrasound 10/16/2013  CT angiogram of the chest 10/16/2013   HIDA Scan 10/02/2013  VQ scan 10/03/2013  Antibiotics:  IV azithromycin 10/02/2013 >>> 10/02/2013  IV Rocephin  10/02/2013>>>> 10/02/2013  IV Zosyn 10/02/2013>>>> 10/04/2013  IV vancomycin 10/02/2013>>>> 10/04/2013  HPI/Subjective: Patient denies nausea vomiting and no abdominal pain.   Objective: Filed Vitals:   10/05/13 1324  BP: 170/75  Pulse: 69  Temp: 98.8 F (37.1 C)  Resp: 16    Intake/Output Summary (Last 24 hours) at 10/05/13 1817 Last data filed at 10/05/13 0930  Gross per 24 hour  Intake    120 ml  Output    501 ml  Net   -381 ml   Filed Weights   10/02/13 0252 10/02/13 0600  Weight: 127.007 kg (280 lb) 127.325 kg (280 lb 11.2 oz)    Exam:   General:  NAD  Cardiovascular: RRR  Respiratory: CTAB  Abdomen: Obese, soft, nontender, nondistended, positive bowel sounds.  Extremities: No clubbing cyanosis or edema.  Data Reviewed: Basic Metabolic Panel:  Recent Labs Lab 10/14/2013 1812 10/02/13 0830 10/02/13 0840 10/03/13 6767 10/04/13 0944 10/05/13 0512  NA 139  --  138 139 138 139  K 5.4*  --  4.3 4.3 4.5 4.2  CL 103  --  105 106 105 106  CO2 20  --  21 20 22 21   GLUCOSE 158*  --  119* 103* 91 103*  BUN 32*  --  36* 35* 25* 25*  CREATININE 1.63*  --  1.63* 1.54* 1.29 1.24  CALCIUM 10.2  --  9.5 9.8 9.8 9.9  MG  --  2.2  --   --   --   --    Liver Function Tests:  Recent Labs Lab 09/30/2013 1812 10/02/13 0840 10/04/13 0944 10/05/13 0512  AST 107* 84* 130* 153*  ALT 102* 83* 94* 109*  ALKPHOS 325* 276* 282* 306*  BILITOT 1.6* 1.3* 1.9* 1.8*  PROT 8.0 7.2 6.9 7.3  ALBUMIN 2.9* 2.5* 2.4*  2.5*    Recent Labs Lab 10/13/2013 1812  LIPASE 37   No results found for this basename: AMMONIA,  in the last 168 hours CBC:  Recent Labs Lab 09/30/2013 1812 10/02/13 0840 10/03/13 0632 10/04/13 0944 10/05/13 0512  WBC 6.9 6.5 6.4 5.9 6.1  NEUTROABS  --  4.9  --  4.1  --   HGB 8.4* 7.1* 8.5* 8.2* 8.1*  HCT 26.5* 21.7* 25.8* 24.6* 25.1*  MCV 94.0 92.7 89.6 89.5 90.9  PLT 199 153 141* 125* 116*   Cardiac Enzymes: No results found for this  basename: CKTOTAL, CKMB, CKMBINDEX, TROPONINI,  in the last 168 hours BNP (last 3 results) No results found for this basename: PROBNP,  in the last 8760 hours CBG:  Recent Labs Lab 10/10/2013 1730  GLUCAP 156*    Recent Results (from the past 240 hour(s))  CULTURE, BLOOD (ROUTINE X 2)     Status: None   Collection Time    10/02/13  8:40 AM      Result Value Range Status   Specimen Description BLOOD LEFT ARM   Final   Special Requests BOTTLES DRAWN AEROBIC ONLY 10CC   Final   Culture  Setup Time     Final   Value: 10/02/2013 16:12     Performed at [facility]   Culture     Final   Value:        BLOOD CULTURE RECEIVED NO GROWTH TO DATE CULTURE WILL BE HELD FOR 5 DAYS BEFORE ISSUING A FINAL NEGATIVE REPORT     Performed at [facility]   Report Status PENDING   Incomplete  CULTURE, BLOOD (ROUTINE X 2)     Status: None   Collection Time    10/02/13  8:45 AM      Result Value Range Status   Specimen Description BLOOD LEFT HAND   Final   Special Requests BOTTLES DRAWN AEROBIC ONLY 10CC   Final   Culture  Setup Time     Final   Value: 10/02/2013 16:13     Performed at [facility]   Culture     Final   Value:        BLOOD CULTURE RECEIVED NO GROWTH TO DATE CULTURE WILL BE HELD FOR 5 DAYS BEFORE ISSUING A FINAL NEGATIVE REPORT     Performed at [facility]   Report Status PENDING   Incomplete     Studies: No results found.  Scheduled Meds: . allopurinol  100 mg Oral Daily  . antiseptic oral rinse  15 mL Mouth Rinse BID  . aspirin EC  325 mg Oral Daily  . metoprolol  75 mg Oral BID  . senna-docusate  1 tablet Oral BID  . terazosin  20 mg Oral QHS   Continuous Infusions: . sodium chloride 50 mL/hr at 10/04/13 2130    Principal Problem:   Abdominal pain, unspecified site Active Problems:   Multiple myeloma   Prostate cancer   Anemia   Renal failure   Liver mass   Splenic mass   Hypertension    Time spent: 35  minutes    Sheila Oats M.D. [facility] Pager [phone]. If 7PM-7AM, please contact night-coverage at [email or web address], password [facility] 10/05/2013, 6:17 PM  LOS: 4 days

## 2013-10-05 NOTE — Progress Notes (Signed)
Placed  Patient on Cpap of 7.  Patient is tolerating well.

## 2013-10-06 ENCOUNTER — Inpatient Hospital Stay (HOSPITAL_COMMUNITY): Payer: Medicare Other

## 2013-10-06 LAB — COMPREHENSIVE METABOLIC PANEL
ALT: 100 U/L — ABNORMAL HIGH (ref 0–53)
AST: 142 U/L — ABNORMAL HIGH (ref 0–37)
Albumin: 2.4 g/dL — ABNORMAL LOW (ref 3.5–5.2)
Alkaline Phosphatase: 303 U/L — ABNORMAL HIGH (ref 39–117)
BUN: 19 mg/dL (ref 6–23)
CALCIUM: 10 mg/dL (ref 8.4–10.5)
CO2: 19 meq/L (ref 19–32)
Chloride: 106 mEq/L (ref 96–112)
Creatinine, Ser: 1.06 mg/dL (ref 0.50–1.35)
GFR, EST AFRICAN AMERICAN: 77 mL/min — AB (ref >90)
GFR, EST NON AFRICAN AMERICAN: 66 mL/min — AB (ref >90)
GLUCOSE: 99 mg/dL (ref 70–99)
Potassium: 4.5 mEq/L (ref 3.7–5.3)
SODIUM: 138 meq/L (ref 137–147)
Total Bilirubin: 1.4 mg/dL — ABNORMAL HIGH (ref 0.3–1.2)
Total Protein: 6.8 g/dL (ref 6.0–8.3)

## 2013-10-06 LAB — CBC
HEMATOCRIT: 23.5 % — AB (ref 39.0–52.0)
HEMOGLOBIN: 8 g/dL — AB (ref 13.0–17.0)
MCH: 30.9 pg (ref 26.0–34.0)
MCHC: 34 g/dL (ref 30.0–36.0)
MCV: 90.7 fL (ref 78.0–100.0)
Platelets: 106 10*3/uL — ABNORMAL LOW (ref 150–400)
RBC: 2.59 MIL/uL — AB (ref 4.22–5.81)
RDW: 18.3 % — ABNORMAL HIGH (ref 11.5–15.5)
WBC: 5.6 10*3/uL (ref 4.0–10.5)

## 2013-10-06 MED ORDER — FENTANYL CITRATE 0.05 MG/ML IJ SOLN
INTRAMUSCULAR | Status: AC
  Filled 2013-10-06: qty 4

## 2013-10-06 MED ORDER — SODIUM CHLORIDE 0.9 % IV SOLN
INTRAVENOUS | Status: AC | PRN
  Administered 2013-10-06: 30 mL/h via INTRAVENOUS

## 2013-10-06 MED ORDER — MIDAZOLAM HCL 2 MG/2ML IJ SOLN
INTRAMUSCULAR | Status: AC
  Filled 2013-10-06: qty 4

## 2013-10-06 MED ORDER — MIDAZOLAM HCL 2 MG/2ML IJ SOLN
INTRAMUSCULAR | Status: AC | PRN
  Administered 2013-10-06: 12:00:00 0.5 mg via INTRAVENOUS

## 2013-10-06 MED ORDER — FENTANYL CITRATE 0.05 MG/ML IJ SOLN
INTRAMUSCULAR | Status: AC | PRN
  Administered 2013-10-06: 25 ug via INTRAVENOUS

## 2013-10-06 NOTE — Progress Notes (Signed)
Physical Therapy Treatment Patient Details Name: Gilbert Deleon MRN: [id number] DOB: 03-16-1935 Today's Date: 10/06/2013 Time: 5366-4403 PT Time Calculation (min): 25 min  PT Assessment / Plan / Recommendation  History of Present Illness 79 year old male with history of multiple myeloma not on any therapy since August 2013, prostate cancer treated with hormonal therapy only, presented to [facility] ED 10/02/2013 with progressively worsening shortness of breath for the past 2-3 months, mostly with exertion but occasionally present at rest. Pt also reported having developed productive cough, subjective fevers, chills, abdominal discomfort n RUQ area. His evaluation in ED and so far has included CT angio which ruled out PE but it did show possible liver mets. HIDA scan was negative for acute cholecystitis. US abdomen showed  several rounded hypoechoic rimmed masses which may represent metastatic disease.   PT Comments   Pt states he feels "much better" and was able to perform bed mobility and sit to stands from elevated surface with mod A overall.  Pt with good activity tolerance, pt able to stand 8x.  Follow Up Recommendations  SNF     Does the patient have the potential to tolerate intense rehabilitation     Barriers to Discharge        Equipment Recommendations  Rolling walker with 5" wheels    Recommendations for Other Services    Frequency Min 3X/week   Progress towards PT Goals Progress towards PT goals: Progressing toward goals  Plan Current plan remains appropriate    Precautions / Restrictions Precautions Precautions: Fall Restrictions Weight Bearing Restrictions: No   Pertinent Vitals/Pain No c/o pain during session    Mobility  Bed Mobility Overal bed mobility: Needs Assistance Bed Mobility: Supine to Sit;Sit to Supine Supine to sit: Mod assist Sit to supine: Max assist General bed mobility comments: pt able to lift LEs out of bed, needs assist for lifting trunk for supine to sit  and lifting LEs sit to supine.  uses rails to assist with upper body Transfers Overall transfer level: Needs assistance Equipment used: Rolling walker (2 wheeled) Transfers: Sit to/from Stand Sit to Stand: Min assist;Mod assist;From elevated surface General transfer comment: pt performed sit <> stand x 8 from elevated surface, able to stand 10-20 seconds at most due to LE fatigue.  Difficulty extending trunk in standing, stands in forward flexed posture.  Pt able to static stand with close supervision so PT could assist pt with hygiene after bowel incontinence.      Exercises     PT Diagnosis:    PT Problem List:   PT Treatment Interventions:     PT Goals (current goals can now be found in the care plan section)    Visit Information  Last PT Received On: 10/06/13 Assistance Needed: +2 History of Present Illness: 79 year old male with history of multiple myeloma not on any therapy since August 2013, prostate cancer treated with hormonal therapy only, presented to [facility] ED 10/07/2013 with progressively worsening shortness of breath for the past 2-3 months, mostly with exertion but occasionally present at rest. Pt also reported having developed productive cough, subjective fevers, chills, abdominal discomfort n RUQ area. His evaluation in ED and so far has included CT angio which ruled out PE but it did show possible liver mets. HIDA scan was negative for acute cholecystitis. US abdomen showed  several rounded hypoechoic rimmed masses which may represent metastatic disease.    Subjective Data      Cognition  Cognition Arousal/Alertness: Awake/alert Behavior  During Therapy: WFL for tasks assessed/performed Overall Cognitive Status: Within Functional Limits for tasks assessed    Balance     End of Session PT - End of Session Equipment Utilized During Treatment: Gait belt Activity Tolerance: Patient tolerated treatment well Patient left: in bed;with call bell/phone within reach;with bed  alarm set Nurse Communication: Mobility status   GP     Jaleigh Mccroskey 10/06/2013, 10:48 AM

## 2013-10-06 NOTE — ED Notes (Signed)
02 2l/[place] started

## 2013-10-06 NOTE — ED Notes (Signed)
In [place], preparing for procedure

## 2013-10-06 NOTE — Clinical Social Work Placement (Signed)
Clinical Social Work Department CLINICAL SOCIAL WORK PLACEMENT NOTE 10/06/2013  Patient:  Gilbert Deleon, Gilbert Deleon  Account Number:  [id number] [place] date:  10/02/2013  Clinical Social Worker:  Kemper Durie, [place]  Date/time:  10/04/2013 03:00 PM  Clinical Social Work is seeking post-discharge placement for this patient at the following level of care:   [place]   (*CSW will update this form in Epic as items are completed)     Patient/family provided with [place] Department of Clinical Social Work's list of facilities offering this level of care within the geographic area requested by the patient (or if unable, by the patient's family).    Patient/family informed of their freedom to choose among providers that offer the needed level of care, that participate in Medicare, Medicaid or managed care program needed by the patient, have an available bed and are willing to accept the patient.    Patient/family informed of [facility]' ownership interest in [facility], as well as of the fact that they are under no obligation to receive care at this facility.  PASARR submitted to EDS on  PASARR number received from [place] on   FL2 transmitted to all facilities in geographic area requested by pt/family on  10/04/2013 FL2 transmitted to all facilities within larger geographic area on   Patient informed that his/her managed care company has contracts with or will negotiate with  certain facilities, including the following:     Patient/family informed of bed offers received:  10/06/2013 Patient chooses bed at  Physician recommends and patient chooses bed at    Patient to be transferred to  on   Patient to be transferred to facility by   The following physician request were entered in Epic:   Additional Comments: Patient is requesting placement in [facility] in [place] area).   Liz Beach, [place], [place], [phone]

## 2013-10-06 NOTE — Progress Notes (Signed)
[facility] PROGRESS NOTE  Gilbert Deleon PIR:[id number] DOB: 1935/05/14 DOA: 10/24/2013 PCP: No PCP Per Patient  Assessment/Plan: #1 abdominal pain Questionable etiology. May be likely secondary to liver lesions that are concerning for metastases. Had a scan was negative for acute cholecystitis. Patient hasn't been seen by general surgery and that if patient has acute cholecystitis. Surgery recommended an open MRI/MRCP as outpatient for further evaluation of liver lesions the biliary tree. Continue supportive care, anti-emetics, analgesia. -IV antibiotics DC on 1/6. -Further pain at this time, follow.  #2 acute respiratory distress Questionable etiology. Chest x-ray was negative for pneumonia. Influenza PCR was negative. Patient with clinical improvement. Blood cultures were no growth to date. VQ scan negative for PE. Will discontinue empiric IV antibiotics. Follow clinically.  #3 abnormal LFTs Likely secondary to probable metastatic liver disease. Patient will likely need MRI MRCP open as outpatient for further evaluation.  -LFTs  trending down  today -s/p ultrasound-guided biopsy of liver lesions today 1/8, will follow upper results in am. #4 anemia of chronic disease H&H stable.   #5 acute renal failure Likely secondary to prerenal azotemia. ARB on hold. Renal function improvement. Continue hydration. Will follow.  #6 hypertension Continue Lopressor 75 mg twice daily. Continue Hytrin. Follow.  #7 prophylaxis SCDs for DVT prophylaxis.  Code Status: Full Family Communication: Updated patient no family at bedside. Disposition Plan: Skilled nursing facility when medically stable.   Consultants:  General surgery: Dr. Donne Hazel 10/02/2013  Procedures:  Chest x-ray 10/29/2013  Abdominal ultrasound 10/16/2013  CT angiogram of the chest 09/29/2013   HIDA Scan 10/02/2013  VQ scan 10/03/2013  Antibiotics:  IV azithromycin 10/02/2013 >>> 10/02/2013  IV Rocephin  10/02/2013>>>> 10/02/2013  IV Zosyn 10/02/2013>>>> 10/04/2013  IV vancomycin 10/02/2013>>>> 10/04/2013  HPI/Subjective: Sleepy status post biopsy, easily aroused-denies any complaints. Objective: Filed Vitals:   10/06/13 1500  BP: 162/83  Pulse: 76  Temp:   Resp: 20    Intake/Output Summary (Last 24 hours) at 10/06/13 1816 Last data filed at 10/06/13 1500  Gross per 24 hour  Intake 1065.83 ml  Output   1001 ml  Net  64.83 ml   Filed Weights   10/02/13 0252 10/02/13 0600  Weight: 127.007 kg (280 lb) 127.325 kg (280 lb 11.2 oz)    Exam:   General:  NAD  Cardiovascular: RRR  Respiratory: CTAB  Abdomen: Obese, soft, nontender, nondistended, positive bowel sounds.  Extremities: No clubbing cyanosis or edema.  Data Reviewed: Basic Metabolic Panel:  Recent Labs Lab 10/20/2013 1812 10/02/13 0830 10/02/13 0840 10/03/13 6301 10/04/13 0944 10/05/13 0512 10/06/13 0650  NA 139  --  138 139 138 139 138  K 5.4*  --  4.3 4.3 4.5 4.2 4.5  CL 103  --  105 106 105 106 106  CO2 20  --  21 20 22 21 19   GLUCOSE 158*  --  119* 103* 91 103* 99  BUN 32*  --  36* 35* 25* 25* 19  CREATININE 1.63*  --  1.63* 1.54* 1.29 1.24 1.06  CALCIUM 10.2  --  9.5 9.8 9.8 9.9 10.0  MG  --  2.2  --   --   --   --   --    Liver Function Tests:  Recent Labs Lab 10/23/2013 1812 10/02/13 0840 10/04/13 0944 10/05/13 0512 10/06/13 0650  AST 107* 84* 130* 153* 142*  ALT 102* 83* 94* 109* 100*  ALKPHOS 325* 276* 282* 306* 303*  BILITOT 1.6* 1.3* 1.9* 1.8* 1.4*  PROT 8.0 7.2 6.9 7.3 6.8  ALBUMIN 2.9* 2.5* 2.4* 2.5* 2.4*    Recent Labs Lab 10/25/2013 1812  LIPASE 37   No results found for this basename: AMMONIA,  in the last 168 hours CBC:  Recent Labs Lab 10/05/2013 1812 10/02/13 0840 10/03/13 0632 10/04/13 0944 10/05/13 0512 10/06/13 0650  WBC 6.9 6.5 6.4 5.9 6.1 5.6  NEUTROABS  --  4.9  --  4.1  --   --   HGB 8.4* 7.1* 8.5* 8.2* 8.1* 8.0*  HCT 26.5* 21.7* 25.8* 24.6* 25.1*  23.5*  MCV 94.0 92.7 89.6 89.5 90.9 90.7  PLT 199 153 141* 125* 116* 106*   Cardiac Enzymes: No results found for this basename: CKTOTAL, CKMB, CKMBINDEX, TROPONINI,  in the last 168 hours BNP (last 3 results) No results found for this basename: PROBNP,  in the last 8760 hours CBG:  Recent Labs Lab 10/26/2013 1730  GLUCAP 156*    Recent Results (from the past 240 hour(s))  CULTURE, BLOOD (ROUTINE X 2)     Status: None   Collection Time    10/02/13  8:40 AM      Result Value Range Status   Specimen Description BLOOD LEFT ARM   Final   Special Requests BOTTLES DRAWN AEROBIC ONLY 10CC   Final   Culture  Setup Time     Final   Value: 10/02/2013 16:12     Performed at [facility]   Culture     Final   Value:        BLOOD CULTURE RECEIVED NO GROWTH TO DATE CULTURE WILL BE HELD FOR 5 DAYS BEFORE ISSUING A FINAL NEGATIVE REPORT     Performed at [facility]   Report Status PENDING   Incomplete  CULTURE, BLOOD (ROUTINE X 2)     Status: None   Collection Time    10/02/13  8:45 AM      Result Value Range Status   Specimen Description BLOOD LEFT HAND   Final   Special Requests BOTTLES DRAWN AEROBIC ONLY 10CC   Final   Culture  Setup Time     Final   Value: 10/02/2013 16:13     Performed at [facility]   Culture     Final   Value:        BLOOD CULTURE RECEIVED NO GROWTH TO DATE CULTURE WILL BE HELD FOR 5 DAYS BEFORE ISSUING A FINAL NEGATIVE REPORT     Performed at [facility]   Report Status PENDING   Incomplete     Studies: US Biopsy  10/06/2013   CLINICAL DATA:  79 year old male with a history of malignant melanoma. He has multiple hepatic nodules as well as a mass in the region the pancreatic tail. Ultrasound-guided core biopsy is requested to facilitate the etiology of the presumed metastatic disease.  EXAM: ULTRASOUND GUIDED core needle BIOPSY OF liver lesion  MEDICATIONS: 0.5 mg IV Versed; 25 mcg IV Fentanyl  Total Moderate Sedation Time: 16   PROCEDURE: The procedure, risks, benefits, and alternatives were explained to the patient. Questions regarding the procedure were encouraged and answered. The patient understands and consents to the procedure.  The right upper quadrant was prepped with Betadine in a sterile fashion, and a sterile drape was applied covering the operative field. A sterile gown and sterile gloves were used for the procedure. Local anesthesia was provided with 1% Lidocaine.  The right upper quadrant was interrogated with ultrasound. The liver is diffusely  heterogeneous. There are several fairly well-defined nodular opacities consistent with metastatic lesions. A suitable skin entry site was selected and marked. Local anesthesia was then obtained by infiltration with 1% lidocaine. Using real-time sonographic guidance, a 17 gauge trocar needle was carefully advanced into the right hemi liver into the margin of 1 of the lesions. Multiple 18 gauge core biopsies were then coaxially obtained using the BioPince automated biopsy device. As the biopsy needle was withdrawn, the tract was embolized with a Gel-Foam slurry. Post biopsy ultrasound imaging demonstrates no significant perihepatic hematoma or evidence of active hemorrhage.  The patient tolerated the procedure well appear  COMPLICATIONS: None.  IMPRESSION: Technically successful ultrasound-guided core biopsy of right hepatic lesion.  Signed,  Criselda Peaches, MD  [facility]  [facility]   Electronically Signed   By: Jacqulynn Cadet M.D.   On: 10/06/2013 13:55    Scheduled Meds: . allopurinol  100 mg Oral Daily  . antiseptic oral rinse  15 mL Mouth Rinse BID  . aspirin EC  325 mg Oral Daily  . fentaNYL      . metoprolol  75 mg Oral BID  . midazolam      . senna-docusate  1 tablet Oral BID  . terazosin  20 mg Oral QHS   Continuous Infusions: . sodium chloride 50 mL/hr at 10/04/13 2130    Principal Problem:   Abdominal  pain, unspecified site Active Problems:   Multiple myeloma   Prostate cancer   Anemia   Renal failure   Liver mass   Splenic mass   Hypertension    Time spent: 35 minutes    Sheila Oats M.D. [facility] Pager [phone]. If 7PM-7AM, please contact night-coverage at [email or web address], password [facility] 10/06/2013, 6:16 PM  LOS: 5 days

## 2013-10-06 NOTE — Procedures (Signed)
Interventional Radiology Procedure Note  Procedure: US guided core biopsy of right liver lesion Complications: None Recommendations: - Bedrest x 3 hrs - Path pending  Signed,  Criselda Peaches, MD [facility]

## 2013-10-06 NOTE — Progress Notes (Signed)
Patient ID: Gilbert Deleon, male   DOB: September 04, 1935, 79 y.o.   MRN: [id number]   Pt much more clear today Able to say name; dob;  Oriented to place and yr. Understands procedure of liver biopsy to be performed today Understands all risks nd benefits and agreeable to proceed Consent signed and in chart

## 2013-10-06 NOTE — ED Notes (Signed)
C02 monitor not picking up well, large upper lip, prongs out of nose if in front of mouth.

## 2013-10-06 NOTE — ED Notes (Signed)
In [facility] [place], awaiting US biopsy.  Arousable to name, answered name and DOB, aware of biopsytoday, procedure explained.

## 2013-10-06 NOTE — ED Notes (Signed)
Pt denies pain at this time

## 2013-10-06 NOTE — ED Notes (Signed)
o2 d/c'd 

## 2013-10-06 NOTE — Progress Notes (Signed)
Patient placed on CPAP at this time. Tolerating well, RT will continue to monitor. 

## 2013-10-06 NOTE — ED Notes (Signed)
Transport here to go back to room 5w22.

## 2013-10-07 MED ORDER — METOPROLOL TARTRATE 50 MG PO TABS: 75.0000 mg | ORAL_TABLET | Freq: Two times a day (BID) | ORAL | Status: DC

## 2013-10-07 MED ORDER — LOSARTAN POTASSIUM 25 MG PO TABS
25.0000 mg | ORAL_TABLET | Freq: Every day | ORAL | Status: DC
  Administered 2013-10-07 – 2013-10-14 (×8): 25 mg via ORAL
  Filled 2013-10-07 (×9): qty 1

## 2013-10-07 NOTE — Clinical Social Work Note (Signed)
Final med rec faxed to [facility]. Weekend CSW to assist with DC to facility if patient is medically stable to do so.  Liz Beach, [place], [place], [phone]

## 2013-10-07 NOTE — Clinical Social Work Placement (Signed)
Clinical Social Work Department CLINICAL SOCIAL WORK PLACEMENT NOTE 10/07/2013  Patient:  Gilbert Deleon, Gilbert Deleon  Account Number:  [id number] Admit date:  10/20/2013  Clinical Social Worker:  Kemper Durie, [place]  Date/time:  10/04/2013 03:00 PM  Clinical Social Work is seeking post-discharge placement for this patient at the following level of care:   [place]   (*CSW will update this form in Epic as items are completed)     Patient/family provided with [place] Department of Clinical Social Work's list of facilities offering this level of care within the geographic area requested by the patient (or if unable, by the patient's family).    Patient/family informed of their freedom to choose among providers that offer the needed level of care, that participate in Medicare, Medicaid or managed care program needed by the patient, have an available bed and are willing to accept the patient.    Patient/family informed of [facility]' ownership interest in [facility], as well as of the fact that they are under no obligation to receive care at this facility.  PASARR submitted to EDS on  PASARR number received from [place] on   FL2 transmitted to all facilities in geographic area requested by pt/family on  10/04/2013 FL2 transmitted to all facilities within larger geographic area on   Patient informed that his/her managed care company has contracts with or will negotiate with  certain facilities, including the following:     Patient/family informed of bed offers received:  10/06/2013 Patient chooses bed at OTHER Physician recommends and patient chooses bed at    Patient to be transferred to OTHER on   Patient to be transferred to facility by Ambulance  The following physician request were entered in Epic:   Additional Comments: Patient has chosen bed at [facility] in [place]. Expecting a weekend DC. Unit CSW has set the DC up with facility to take place over  weekend if he is appropriate for DC. Report left for weekend coverage.   Liz Beach, [place], [place], [phone]

## 2013-10-07 NOTE — Progress Notes (Addendum)
[facility] PROGRESS NOTE  Gilbert Deleon DJM:[id number] DOB: April 12, 1935 DOA: 10/13/2013 PCP: No PCP Per Patient  Assessment/Plan: #1 abdominal pain Questionable etiology. May be likely secondary to liver lesions that are concerning for metastases. Had a scan was negative for acute cholecystitis. Patient hasn't been seen by general surgery and that if patient has acute cholecystitis. Surgery recommended an open MRI/MRCP as outpatient for further evaluation of liver lesions the biliary tree. Continue supportive care, anti-emetics, analgesia. -IV antibiotics DC on 1/6. -Further pain at this time, follow.  #2 acute respiratory distress Questionable etiology. Chest x-ray was negative for pneumonia. Influenza PCR was negative. Patient with clinical improvement. Blood cultures were no growth to date. VQ scan negative for PE. Will discontinue empiric IV antibiotics. Follow clinically.  #3 abnormal LFTs Likely secondary to probable metastatic liver disease. Patient will likely need MRI MRCP open as outpatient for further evaluation.  -LFTs  trending down on 1/8>> recheck in a.m. -s/p ultrasound-guided biopsy of liver lesions today 1/8,  results pending #4 anemia of chronic disease H&H stable.   #5 acute renal failure Likely secondary to prerenal azotemia. . Renal function improvement.  - Creatinine normalized, will DC IV fluids and resume Cozaar for BP control -see as discussed below  #6 hypertension Continue Lopressor 75 mg twice daily. Continue Hytrin.  -Uncontrolled BP Will resume Cozaar and follow  #7 prophylaxis SCDs for DVT prophylaxis.  Code Status: Full Family Communication: Updated patient no family at bedside. Disposition Plan: Skilled nursing facility pending biopsy results   Consultants:  General surgery: Dr. Donne Hazel 10/02/2013  Procedures:  Chest x-ray 10/25/2013  Abdominal ultrasound 10/07/2013  CT angiogram of the chest 10/17/2013   HIDA Scan  10/02/2013  VQ scan 10/03/2013  Antibiotics:  IV azithromycin 10/02/2013 >>> 10/02/2013  IV Rocephin 10/02/2013>>>> 10/02/2013  IV Zosyn 10/02/2013>>>> 10/04/2013  IV vancomycin 10/02/2013>>>> 10/04/2013  HPI/Subjective: Patient alert and appropriate, denies any new complaints. Objective: Filed Vitals:   10/07/13 1503  BP: 188/78  Pulse: 75  Temp: 98.1 F (36.7 C)  Resp: 20    Intake/Output Summary (Last 24 hours) at 10/07/13 1846 Last data filed at 10/07/13 1400  Gross per 24 hour  Intake 750.84 ml  Output    300 ml  Net 450.84 ml   Filed Weights   10/02/13 0252 10/02/13 0600  Weight: 127.007 kg (280 lb) 127.325 kg (280 lb 11.2 oz)    Exam:   General:  NAD  Cardiovascular: RRR  Respiratory: CTAB  Abdomen: Obese, soft, nontender, nondistended, positive bowel sounds.  Extremities: No clubbing cyanosis or edema.  Data Reviewed: Basic Metabolic Panel:  Recent Labs Lab 10/27/2013 1812 10/02/13 0830 10/02/13 0840 10/03/13 2229 10/04/13 0944 10/05/13 0512 10/06/13 0650  NA 139  --  138 139 138 139 138  K 5.4*  --  4.3 4.3 4.5 4.2 4.5  CL 103  --  105 106 105 106 106  CO2 20  --  21 20 22 21 19   GLUCOSE 158*  --  119* 103* 91 103* 99  BUN 32*  --  36* 35* 25* 25* 19  CREATININE 1.63*  --  1.63* 1.54* 1.29 1.24 1.06  CALCIUM 10.2  --  9.5 9.8 9.8 9.9 10.0  MG  --  2.2  --   --   --   --   --    Liver Function Tests:  Recent Labs Lab 10/22/2013 1812 10/02/13 0840 10/04/13 0944 10/05/13 0512 10/06/13 0650  AST 107* 84* 130* 153* 142*  ALT 102* 83* 94* 109* 100*  ALKPHOS 325* 276* 282* 306* 303*  BILITOT 1.6* 1.3* 1.9* 1.8* 1.4*  PROT 8.0 7.2 6.9 7.3 6.8  ALBUMIN 2.9* 2.5* 2.4* 2.5* 2.4*    Recent Labs Lab 10/08/2013 1812  LIPASE 37   No results found for this basename: AMMONIA,  in the last 168 hours CBC:  Recent Labs Lab 10/08/2013 1812 10/02/13 0840 10/03/13 0632 10/04/13 0944 10/05/13 0512 10/06/13 0650  WBC 6.9 6.5 6.4 5.9  6.1 5.6  NEUTROABS  --  4.9  --  4.1  --   --   HGB 8.4* 7.1* 8.5* 8.2* 8.1* 8.0*  HCT 26.5* 21.7* 25.8* 24.6* 25.1* 23.5*  MCV 94.0 92.7 89.6 89.5 90.9 90.7  PLT 199 153 141* 125* 116* 106*   Cardiac Enzymes: No results found for this basename: CKTOTAL, CKMB, CKMBINDEX, TROPONINI,  in the last 168 hours BNP (last 3 results) No results found for this basename: PROBNP,  in the last 8760 hours CBG:  Recent Labs Lab 09/30/2013 1730  GLUCAP 156*    Recent Results (from the past 240 hour(s))  CULTURE, BLOOD (ROUTINE X 2)     Status: None   Collection Time    10/02/13  8:40 AM      Result Value Range Status   Specimen Description BLOOD LEFT ARM   Final   Special Requests BOTTLES DRAWN AEROBIC ONLY 10CC   Final   Culture  Setup Time     Final   Value: 10/02/2013 16:12     Performed at [facility]   Culture     Final   Value:        BLOOD CULTURE RECEIVED NO GROWTH TO DATE CULTURE WILL BE HELD FOR 5 DAYS BEFORE ISSUING A FINAL NEGATIVE REPORT     Performed at [facility]   Report Status PENDING   Incomplete  CULTURE, BLOOD (ROUTINE X 2)     Status: None   Collection Time    10/02/13  8:45 AM      Result Value Range Status   Specimen Description BLOOD LEFT HAND   Final   Special Requests BOTTLES DRAWN AEROBIC ONLY 10CC   Final   Culture  Setup Time     Final   Value: 10/02/2013 16:13     Performed at [facility]   Culture     Final   Value:        BLOOD CULTURE RECEIVED NO GROWTH TO DATE CULTURE WILL BE HELD FOR 5 DAYS BEFORE ISSUING A FINAL NEGATIVE REPORT     Performed at [facility]   Report Status PENDING   Incomplete     Studies: US Biopsy  10/06/2013   CLINICAL DATA:  79 year old male with a history of malignant melanoma. He has multiple hepatic nodules as well as a mass in the region the pancreatic tail. Ultrasound-guided core biopsy is requested to facilitate the etiology of the presumed metastatic disease.  EXAM: ULTRASOUND GUIDED  core needle BIOPSY OF liver lesion  MEDICATIONS: 0.5 mg IV Versed; 25 mcg IV Fentanyl  Total Moderate Sedation Time: 16  PROCEDURE: The procedure, risks, benefits, and alternatives were explained to the patient. Questions regarding the procedure were encouraged and answered. The patient understands and consents to the procedure.  The right upper quadrant was prepped with Betadine in a sterile fashion, and a sterile drape was applied covering the operative field. A sterile gown and sterile gloves were used for the  procedure. Local anesthesia was provided with 1% Lidocaine.  The right upper quadrant was interrogated with ultrasound. The liver is diffusely heterogeneous. There are several fairly well-defined nodular opacities consistent with metastatic lesions. A suitable skin entry site was selected and marked. Local anesthesia was then obtained by infiltration with 1% lidocaine. Using real-time sonographic guidance, a 17 gauge trocar needle was carefully advanced into the right hemi liver into the margin of 1 of the lesions. Multiple 18 gauge core biopsies were then coaxially obtained using the BioPince automated biopsy device. As the biopsy needle was withdrawn, the tract was embolized with a Gel-Foam slurry. Post biopsy ultrasound imaging demonstrates no significant perihepatic hematoma or evidence of active hemorrhage.  The patient tolerated the procedure well appear  COMPLICATIONS: None.  IMPRESSION: Technically successful ultrasound-guided core biopsy of right hepatic lesion.  Signed,  Criselda Peaches, MD  [facility]  [facility]   Electronically Signed   By: Jacqulynn Cadet M.D.   On: 10/06/2013 13:55    Scheduled Meds: . allopurinol  100 mg Oral Daily  . antiseptic oral rinse  15 mL Mouth Rinse BID  . aspirin EC  325 mg Oral Daily  . metoprolol  75 mg Oral BID  . senna-docusate  1 tablet Oral BID  . terazosin  20 mg Oral QHS   Continuous  Infusions: . sodium chloride 50 mL/hr at 10/06/13 2025    Principal Problem:   Abdominal pain, unspecified site Active Problems:   Multiple myeloma   Prostate cancer   Anemia   Renal failure   Liver mass   Splenic mass   Hypertension    Time spent: 25 minutes    Sheila Oats M.D. [facility] Pager [phone]. If 7PM-7AM, please contact night-coverage at [email or web address], password [facility] 10/07/2013, 6:46 PM  LOS: 6 days

## 2013-10-07 NOTE — Clinical Social Work Note (Signed)
Facility willing to take patient over weekend, if final med rec can be faxed over before 4:30PM this afternoon. MD aware.  Liz Beach, [place], [place], [phone]

## 2013-10-08 ENCOUNTER — Encounter (HOSPITAL_COMMUNITY): Payer: Self-pay | Admitting: Hematology and Oncology

## 2013-10-08 DIAGNOSIS — R6881 Early satiety: Secondary | ICD-10-CM

## 2013-10-08 DIAGNOSIS — C252 Malignant neoplasm of tail of pancreas: Secondary | ICD-10-CM

## 2013-10-08 DIAGNOSIS — C787 Secondary malignant neoplasm of liver and intrahepatic bile duct: Principal | ICD-10-CM

## 2013-10-08 DIAGNOSIS — N179 Acute kidney failure, unspecified: Secondary | ICD-10-CM

## 2013-10-08 DIAGNOSIS — R0602 Shortness of breath: Secondary | ICD-10-CM

## 2013-10-08 LAB — CULTURE, BLOOD (ROUTINE X 2)
CULTURE: NO GROWTH
Culture: NO GROWTH

## 2013-10-08 LAB — COMPREHENSIVE METABOLIC PANEL
ALBUMIN: 2.4 g/dL — AB (ref 3.5–5.2)
ALT: 82 U/L — AB (ref 0–53)
AST: 107 U/L — AB (ref 0–37)
Alkaline Phosphatase: 282 U/L — ABNORMAL HIGH (ref 39–117)
BUN: 25 mg/dL — ABNORMAL HIGH (ref 6–23)
CHLORIDE: 107 meq/L (ref 96–112)
CO2: 20 meq/L (ref 19–32)
CREATININE: 1.2 mg/dL (ref 0.50–1.35)
Calcium: 10.5 mg/dL (ref 8.4–10.5)
GFR calc Af Amer: 66 mL/min — ABNORMAL LOW (ref >90)
GFR calc non Af Amer: 57 mL/min — ABNORMAL LOW (ref >90)
Glucose, Bld: 95 mg/dL (ref 70–99)
Potassium: 4.6 mEq/L (ref 3.7–5.3)
SODIUM: 139 meq/L (ref 137–147)
Total Bilirubin: 1.4 mg/dL — ABNORMAL HIGH (ref 0.3–1.2)
Total Protein: 6.9 g/dL (ref 6.0–8.3)

## 2013-10-08 LAB — STREP PNEUMONIAE URINARY ANTIGEN: Strep Pneumo Urinary Antigen: NEGATIVE

## 2013-10-08 MED ORDER — SODIUM CHLORIDE 0.9 % IV SOLN
INTRAVENOUS | Status: DC
  Administered 2013-10-08 – 2013-10-09 (×2): via INTRAVENOUS

## 2013-10-08 MED ORDER — IOHEXOL 300 MG/ML  SOLN
25.0000 mL | INTRAMUSCULAR | Status: AC
  Administered 2013-10-09 (×2): 25 mL via ORAL

## 2013-10-08 NOTE — Consult Note (Signed)
[place] CONSULT NOTE  Patient Care Team: No Pcp Per Patient as PCP - General (General Practice)  CHIEF COMPLAINTS/PURPOSE OF CONSULTATION:  Metastatic adenocarcinoma to the liver, on background history of multiple myeloma and prostate cancer  HISTORY OF PRESENTING ILLNESS:  Gilbert Deleon 79 y.o. male is here because of shortness of breath and severe anemia. I reviewed his records extensively and collaborated the history with the patient and his brother The patient himself is a poor historian According to him, in 2010, screening PSA came back abnormal. He had prostate biopsy and was told that he had prostate cancer. He does not know the stage of the disease. According to him he was treated with some form of hormonal manipulation in the form of injection every 6 months. His next injection is due in 3 months and according to him, the injections were working and his PSA level was coming down. At the same year 2010, he had further investigation with bone marrow biopsy and was found to have multiple myeloma. All his records are at the [place]. He was treated with combination therapy with melphalan, thalidomide and prednisone for over 2 years. His treatment was discontinued approximately 16 months ago for some reason. He could not tell me why his treatment was discontinued but I suspect was due to peripheral neuropathy. For the last several months, he's been complaining of early satiety and loss of appetite. He was admitted to a local hospital in November of 2014 for anorexia and anemia. Several weeks ago, he received 2 units of blood transfusion. The patient denies any recent signs or symptoms of bleeding such as spontaneous epistaxis, hematuria or hematochezia.  This time, he was transferred here for management of shortness of breath and anemia. He had CT angiogram done here on 10/01/2013 which show no evidence of pulmonary embolism but abnormalities in the liver and the hilum of the  spleen was noted. The patient was also noted to have abnormal liver function tests. Ultrasound of the abdomen showed masses in the liver suspicious for metastatic disease. On 10/06/2013, he underwent an ultrasound-guided biopsy of the liver mass that came back mucinous adenocarcinoma. Further subtype was not the needed. For his anemia, he received blood transfusions. The patient was also noted to have acute renal failure and after blood transfusions and IV fluid rehydration, it has improved down to normal. Blood culture was negative.  MEDICAL HISTORY:  Past Medical History  Diagnosis Date  . Prostate cancer   . Multiple myeloma   . Hypertension     SURGICAL HISTORY: History reviewed. No pertinent past surgical history.  SOCIAL HISTORY: History   Social History  . Marital Status: Married    Spouse Name: N/A    Number of Children: N/A  . Years of Education: N/A   Occupational History  . Not on file.   Social History Main Topics  . Smoking status: Former Smoker -- 0.50 packs/day for 3 years    Types: Cigarettes    Quit date: 09/29/1958  . Smokeless tobacco: Not on file  . Alcohol Use: 2.5 oz/week    5 drink(s) per week  . Drug Use: No  . Sexual Activity: Not on file   Other Topics Concern  . Not on file   Social History Narrative  . No narrative on file    FAMILY HISTORY: Family History  Problem Relation Age of Onset  . Stomach cancer Mother   . Lung cancer Sister   . Bladder Cancer Brother  ALLERGIES:  has No Known Allergies.  MEDICATIONS:  Current Facility-Administered Medications  Medication Dose Route Frequency Provider Last Rate Last Dose  . allopurinol (ZYLOPRIM) tablet 100 mg  100 mg Oral Daily Berle Mull, MD   100 mg at 10/08/13 1043  . antiseptic oral rinse (BIOTENE) solution 15 mL  15 mL Mouth Rinse BID Theodis Blaze, MD   15 mL at 10/07/13 1024  . aspirin EC tablet 325 mg  325 mg Oral Daily Berle Mull, MD   325 mg at 10/08/13 1043  .  hydrALAZINE (APRESOLINE) injection 10 mg  10 mg Intravenous Q4H PRN Berle Mull, MD   10 mg at 10/07/13 1544  . losartan (COZAAR) tablet 25 mg  25 mg Oral Daily Sheila Oats, MD   25 mg at 10/08/13 1202  . metoprolol tartrate (LOPRESSOR) tablet 75 mg  75 mg Oral BID Eugenie Filler, MD   75 mg at 10/08/13 1043  . senna-docusate (Senokot-S) tablet 1 tablet  1 tablet Oral BID Berle Mull, MD   1 tablet at 10/08/13 1044  . terazosin (HYTRIN) capsule 20 mg  20 mg Oral QHS Berle Mull, MD   20 mg at 10/07/13 2157    REVIEW OF SYSTEMS:   Constitutional: Denies fevers, chills Eyes: Denies blurriness of vision, double vision or watery eyes Ears, nose, mouth, throat, and face: Denies mucositis or sore throat Cardiovascular: Denies palpitation, chest discomfort or lower extremity swelling Gastrointestinal:  Denies nausea, heartburn or change in bowel habits. The patient has chronic constipation and only moving his bowels once a week Skin: Denies abnormal skin rashes Lymphatics: Denies new lymphadenopathy or easy bruising Neurological: He denies new weaknesses of focal neurological deficit Behavioral/Psych: Mood is stable, no new changes  All other systems were reviewed with the patient and are negative.  PHYSICAL EXAMINATION: ECOG PERFORMANCE STATUS: 1 - Symptomatic but completely ambulatory  Filed Vitals:   10/08/13 1454  BP: 167/68  Pulse: 80  Temp: 97.5 F (36.4 C)  Resp: 18   Filed Weights   10/02/13 0252 10/02/13 0600  Weight: 280 lb (127.007 kg) 280 lb 11.2 oz (127.325 kg)    GENERAL:alert, no distress and comfortable SKIN: skin color is pale, texture, turgor are normal, no rashes or significant lesions EYES: normal, conjunctiva are pale and non-injected, sclera clear OROPHARYNX:no exudate, no erythema and lips, buccal mucosa, and tongue normal  NECK: supple, thyroid normal size, non-tender, without nodularity LYMPH:  no palpable lymphadenopathy in the cervical, axillary  or inguinal LUNGS: clear to auscultation and percussion with normal breathing effort HEART: regular rate & rhythm and no murmurs with bilateral lower extremity edema ABDOMEN:abdomen soft, non-tender and normal bowel sounds unable to appreciate splenomegaly Musculoskeletal:no cyanosis of digits and no clubbing  PSYCH: alert & oriented x 3 with fluent speech NEURO: no focal motor/sensory deficits  LABORATORY DATA:  I have reviewed the data as listed Lab Results  Component Value Date   WBC 5.6 10/06/2013   HGB 8.0* 10/06/2013   HCT 23.5* 10/06/2013   MCV 90.7 10/06/2013   PLT 106* 10/06/2013   Lab Results  Component Value Date   NA 139 10/08/2013   K 4.6 10/08/2013   CL 107 10/08/2013   CO2 20 10/08/2013    RADIOGRAPHIC STUDIES: I have personally reviewed the radiological images as listed and agreed with the findings in the report. I agree with the interpretation of the CT angiogram  Ct Angio Chest Pe W/cm &/or Wo Cm  10/07/2013  ADDENDUM REPORT: 10/07/2013 16:01  ADDENDUM: In addition to the findings below, images through the upper abdomen show partial visualization of soft tissue density mass in the area of the pancreatic tail and splenic hilum. This could represent a primary pancreatic carcinoma and the source of the probable liver metastases. Abdomen MRI or CT should be considered for further evaluation.   Electronically Signed   By: Earle Gell M.D.   On: 10/07/2013 16:01   10/07/2013   CLINICAL DATA:  Shortness of breath. Elevated D-dimer. Clinical suspicion for pulmonary embolism. History of prostate carcinoma and multiple myeloma.  EXAM: CT ANGIOGRAPHY CHEST WITH CONTRAST  TECHNIQUE: Multidetector CT imaging of the chest was performed using the standard protocol during bolus administration of intravenous contrast. Multiplanar CT image reconstructions including MIPs were obtained to evaluate the vascular anatomy.  CONTRAST:  148m OMNIPAQUE IOHEXOL 350 MG/ML SOLN  COMPARISON:  None.  FINDINGS:  Satisfactory opacification of pulmonary arteries noted, and no pulmonary emboli identified. No evidence of thoracic aortic dissection or aneurysm. No evidence of mediastinal hematoma or mass.  No lymphadenopathy identified within the thorax. Tiny pleural effusions versus pleural thickening noted bilaterally. Mild cardiomegaly noted. No evidence of pulmonary infiltrate or mass.  Images obtained through the upper abdomen show heterogeneous attenuation of the liver, which may be due to the presence of multiple hypovascular liver metastases.  Review of the MIP images confirms the above findings.  IMPRESSION: No evidence of pulmonary embolism.  Mild cardiomegaly and tiny bilateral pleural effusions versus pleural thickening.  Heterogeneous appearance of liver noted, and multiple hypovascular metastases cannot be excluded. Recommend further imaging characterization with nonemergent abdomen MRI without and with contrast as the preferred exam.  Electronically Signed: By: JEarle GellM.D. On: 10/02/2013 00:07   ASSESSMENT:  Newly diagnosed adenocarcinoma, primary unknown but suspected possible cancer from the stomach/gastrointestinal origin  PLAN:  #1 stage IV metastatic cancer to the liver I recommend ordering CT scan of the abdomen and pelvis with contrast, tumor markers, as well as GI consultation for further evaluation. I do not think this is of prostate origin as an prostate cancer rarely produce mucin I had a long discussion with the hospitalist, the patient and family members. I do not think this is the case that could be worked up as an outpatient. The patient had recent life-threatening GI bleed requiring blood transfusion. His blood work is significantly abnormal. Ultrasound detected multiple masses and his liver function test is grossly abnormal. I am concerned that the patient might be on the verge of liver failure. I am also concerned that no recent abnormal kidney function could be of early form of  hepatorenal syndrome. This patient needs to be hospitalized until his workup is complete and preferably treatment to be started while the patient is hospitalized if possible At the end of the conversation, [facility]. I contacted the hospitalist today and will make arrangement to get him transferred and to initiate workup as soon as possible #2 history of multiple myeloma It is not clear to me whether he still had persistent disease. His corrected serum calcium is mildly elevated. The patient has anemia and mild renal dysfunction. Certainly he would need further workup to recheck rather his myeloma is in remission or not #3 prostate cancer Staging unknown. Last PSA level was unknown but according to the patient his PSA was coming down. [place]  4 severe anemia The cause is likely due to bone marrow infiltration versus chronic GI bleed. Even though he does not have microcytic anemia, iron studies were not done. His anemia could be also multifactorial. At present time he does not require blood transfusion   #5 recent acute renal failure Causes unknown but could be due to early hepatorenal syndrome. Continue observation carefully #6 abnormal liver function tests As above, we need to monitor his liver function tests carefully and adjust medications as needed #7 hypertension He is currently on multiple blood pressure medications   #8 early satiety, low albumin and malnutrition   this is likely due to underlying disease. He is currently followed by nutrition service   [place], Foye Damron, MD @T @ 4:00 PM

## 2013-10-08 NOTE — Progress Notes (Signed)
Patient placed on CPAP at this time. Tolerating well, RT will continue to monitor. 

## 2013-10-08 NOTE — Progress Notes (Signed)
[facility] PROGRESS NOTE  Cordera Stineman TDH:[id number] DOB: [birth year] DOA: 10/22/2013 PCP: No PCP Per Patient  Assessment/Plan: #1 abdominal pain/mucinous Adenocarcinoma -Status post liver biopsy on 1/8 and results showing mucinous adeno and the features favor a primary pancreatobiliary adenocarcinoma.  -I. have consulted oncology, Dr. Alvy Bimler to see pt. -No Further pain at this time, follow.  #2 acute respiratory distress Questionable etiology. Chest x-ray was negative for pneumonia. Influenza PCR was negative. Patient with clinical improvement. Blood cultures were no growth to date. VQ scan negative for PE. . Follow clinically. -Patient remaining or afebrile hemodynamically stable and respiratory status stable off abx  #3 abnormal LFTs Likely secondary to probable metastatic liver disease. Patient will likely need MRI MRCP open as outpatient for further evaluation.  -LFTs  trending down on 1/8>> recheck in a.m. -s/p ultrasound-guided biopsy of liver lesions today 1/8,  results as above #4 anemia of chronic disease H&H stable.   #5 acute renal failure Likely secondary to prerenal azotemia. . Renal function improvement.  - Creatinine normalized, will DC IV fluids and resumed Cozaar for BP control on 1/9 -see as discussed below -Creatinine remains stable #6 hypertension Continue Lopressor 75 mg twice daily. Continue Hytrin.  -BP control improving, continue Cozaar  #7 prophylaxis SCDs for DVT prophylaxis.  Code Status: Full Family Communication: Updated patient and brother(physician at bedside) Disposition Plan: Skilled nursing facility pending biopsy results   Consultants:  General surgery: Dr. Donne Hazel 10/02/2013  Procedures:  Chest x-ray 09/30/2013  Abdominal ultrasound 10/23/2013  CT angiogram of the chest 10/06/2013   HIDA Scan 10/02/2013  VQ scan 10/03/2013  Liver biopsy 1/8 Diagnosis Liver, needle/core biopsy, Right lesion - MUCINOUS  ADENOCARCINOMA. - SEE COMMENT. Microscopic Comment Given the radiologic findings, the features favor a primary pancreatobiliary adenocarcinoma. (JBK:ecj Antibiotics:  IV azithromycin 10/02/2013 >>> 10/02/2013  IV Rocephin 10/02/2013>>>> 10/02/2013  IV Zosyn 10/02/2013>>>> 10/04/2013  IV vancomycin 10/02/2013>>>> 10/04/2013  HPI/Subjective: Patient alert and appropriate, denies any new complaints. His brother(a physician) is at the bedside Objective: Filed Vitals:   10/08/13 1043  BP: 168/83  Pulse: 95  Temp:   Resp:     Intake/Output Summary (Last 24 hours) at 10/08/13 1403 Last data filed at 10/08/13 0625  Gross per 24 hour  Intake      0 ml  Output    450 ml  Net   -450 ml   Filed Weights   10/02/13 0252 10/02/13 0600  Weight: 127.007 kg (280 lb) 127.325 kg (280 lb 11.2 oz)    Exam:   General:  NAD  Cardiovascular: RRR  Respiratory: CTAB  Abdomen: Obese, soft, nontender, nondistended, positive bowel sounds.  Extremities: No clubbing cyanosis or edema.  Data Reviewed: Basic Metabolic Panel:  Recent Labs Lab 10/16/2013 1812 10/02/13 0830  10/03/13 6468 10/04/13 0944 10/05/13 0512 10/06/13 0650 10/08/13 0403  NA 139  --   < > 139 138 139 138 139  K 5.4*  --   < > 4.3 4.5 4.2 4.5 4.6  CL 103  --   < > 106 105 106 106 107  CO2 20  --   < > 20 22 21 19 20   GLUCOSE 158*  --   < > 103* 91 103* 99 95  BUN 32*  --   < > 35* 25* 25* 19 25*  CREATININE 1.63*  --   < > 1.54* 1.29 1.24 1.06 1.20  CALCIUM 10.2  --   < > 9.8 9.8 9.9 10.0 10.5  MG  --  2.2  --   --   --   --   --   --   < > = values in this interval not displayed. Liver Function Tests:  Recent Labs Lab 10/02/13 0840 10/04/13 0944 10/05/13 0512 10/06/13 0650 10/08/13 0403  AST 84* 130* 153* 142* 107*  ALT 83* 94* 109* 100* 82*  ALKPHOS 276* 282* 306* 303* 282*  BILITOT 1.3* 1.9* 1.8* 1.4* 1.4*  PROT 7.2 6.9 7.3 6.8 6.9  ALBUMIN 2.5* 2.4* 2.5* 2.4* 2.4*    Recent Labs Lab  10/27/2013 1812  LIPASE 37   No results found for this basename: AMMONIA,  in the last 168 hours CBC:  Recent Labs Lab 09/30/2013 1812 10/02/13 0840 10/03/13 0632 10/04/13 0944 10/05/13 0512 10/06/13 0650  WBC 6.9 6.5 6.4 5.9 6.1 5.6  NEUTROABS  --  4.9  --  4.1  --   --   HGB 8.4* 7.1* 8.5* 8.2* 8.1* 8.0*  HCT 26.5* 21.7* 25.8* 24.6* 25.1* 23.5*  MCV 94.0 92.7 89.6 89.5 90.9 90.7  PLT 199 153 141* 125* 116* 106*   Cardiac Enzymes: No results found for this basename: CKTOTAL, CKMB, CKMBINDEX, TROPONINI,  in the last 168 hours BNP (last 3 results) No results found for this basename: PROBNP,  in the last 8760 hours CBG:  Recent Labs Lab 10/06/2013 1730  GLUCAP 156*    Recent Results (from the past 240 hour(s))  CULTURE, BLOOD (ROUTINE X 2)     Status: None   Collection Time    10/02/13  8:40 AM      Result Value Range Status   Specimen Description BLOOD LEFT ARM   Final   Special Requests BOTTLES DRAWN AEROBIC ONLY 10CC   Final   Culture  Setup Time     Final   Value: 10/02/2013 16:12     Performed at [place]     Final   Value: NO GROWTH 5 DAYS     Performed at [facility]   Report Status 10/08/2013 FINAL   Final  CULTURE, BLOOD (ROUTINE X 2)     Status: None   Collection Time    10/02/13  8:45 AM      Result Value Range Status   Specimen Description BLOOD LEFT HAND   Final   Special Requests BOTTLES DRAWN AEROBIC ONLY 10CC   Final   Culture  Setup Time     Final   Value: 10/02/2013 16:13     Performed at [place]     Final   Value: NO GROWTH 5 DAYS     Performed at [facility]   Report Status 10/08/2013 FINAL   Final     Studies: No results found.  Scheduled Meds: . allopurinol  100 mg Oral Daily  . antiseptic oral rinse  15 mL Mouth Rinse BID  . aspirin EC  325 mg Oral Daily  . losartan  25 mg Oral Daily  . metoprolol  75 mg Oral BID  . senna-docusate  1 tablet Oral BID  . terazosin  20  mg Oral QHS   Continuous Infusions:    Principal Problem:   Abdominal pain, unspecified site Active Problems:   Multiple myeloma   Prostate cancer   Anemia   Renal failure   Liver mass   Splenic mass   Hypertension    Time spent: 25 minutes    Sheila Oats M.D. [facility] Pager [phone].  If 7PM-7AM, please contact night-coverage at [email or web address], password [facility] 10/08/2013, 2:03 PM  LOS: 7 days

## 2013-10-08 NOTE — Progress Notes (Signed)
Received PO contrast for CT of abd at 2300. Pt in bed and has been asleepfor approximately 1 Hour. This Rn signed for contrast and took CT's number to call when patient awakens enough to drink contrast. Will continue to monitor and assist as needed. Darreld Mclean [facility]

## 2013-10-09 ENCOUNTER — Encounter (HOSPITAL_COMMUNITY): Payer: Self-pay | Admitting: *Deleted

## 2013-10-09 ENCOUNTER — Inpatient Hospital Stay (HOSPITAL_COMMUNITY): Payer: Medicare Other

## 2013-10-09 DIAGNOSIS — R7989 Other specified abnormal findings of blood chemistry: Secondary | ICD-10-CM

## 2013-10-09 DIAGNOSIS — C801 Malignant (primary) neoplasm, unspecified: Secondary | ICD-10-CM

## 2013-10-09 DIAGNOSIS — C259 Malignant neoplasm of pancreas, unspecified: Secondary | ICD-10-CM | POA: Diagnosis present

## 2013-10-09 LAB — BASIC METABOLIC PANEL
BUN: 29 mg/dL — ABNORMAL HIGH (ref 6–23)
CALCIUM: 10.7 mg/dL — AB (ref 8.4–10.5)
CO2: 22 meq/L (ref 19–32)
Chloride: 105 mEq/L (ref 96–112)
Creatinine, Ser: 1.3 mg/dL (ref 0.50–1.35)
GFR calc Af Amer: 60 mL/min — ABNORMAL LOW (ref >90)
GFR, EST NON AFRICAN AMERICAN: 52 mL/min — AB (ref >90)
Glucose, Bld: 123 mg/dL — ABNORMAL HIGH (ref 70–99)
Potassium: 4.6 mEq/L (ref 3.7–5.3)
Sodium: 137 mEq/L (ref 137–147)

## 2013-10-09 LAB — CBC WITH DIFFERENTIAL/PLATELET
Basophils Absolute: 0 10*3/uL (ref 0.0–0.1)
Basophils Relative: 0 % (ref 0–1)
EOS ABS: 0 10*3/uL (ref 0.0–0.7)
Eosinophils Relative: 1 % (ref 0–5)
HEMATOCRIT: 23.9 % — AB (ref 39.0–52.0)
HEMOGLOBIN: 7.7 g/dL — AB (ref 13.0–17.0)
LYMPHS ABS: 0.7 10*3/uL (ref 0.7–4.0)
Lymphocytes Relative: 11 % — ABNORMAL LOW (ref 12–46)
MCH: 29.5 pg (ref 26.0–34.0)
MCHC: 32.2 g/dL (ref 30.0–36.0)
MCV: 91.6 fL (ref 78.0–100.0)
MONOS PCT: 11 % (ref 3–12)
Monocytes Absolute: 0.7 10*3/uL (ref 0.1–1.0)
NEUTROS ABS: 5 10*3/uL (ref 1.7–7.7)
Neutrophils Relative %: 78 % — ABNORMAL HIGH (ref 43–77)
Platelets: 114 10*3/uL — ABNORMAL LOW (ref 150–400)
RBC: 2.61 MIL/uL — AB (ref 4.22–5.81)
RDW: 18.8 % — ABNORMAL HIGH (ref 11.5–15.5)
WBC: 6.4 10*3/uL (ref 4.0–10.5)

## 2013-10-09 LAB — HEPATIC FUNCTION PANEL
ALT: 94 U/L — AB (ref 0–53)
AST: 139 U/L — ABNORMAL HIGH (ref 0–37)
Albumin: 2.4 g/dL — ABNORMAL LOW (ref 3.5–5.2)
Alkaline Phosphatase: 350 U/L — ABNORMAL HIGH (ref 39–117)
BILIRUBIN DIRECT: 0.8 mg/dL — AB (ref 0.0–0.3)
BILIRUBIN TOTAL: 1.4 mg/dL — AB (ref 0.3–1.2)
Indirect Bilirubin: 0.6 mg/dL (ref 0.3–0.9)
Total Protein: 6.9 g/dL (ref 6.0–8.3)

## 2013-10-09 LAB — LEGIONELLA ANTIGEN, URINE: Legionella Antigen, Urine: NEGATIVE

## 2013-10-09 LAB — PROTIME-INR
INR: 1.37 (ref 0.00–1.49)
Prothrombin Time: 16.5 seconds — ABNORMAL HIGH (ref 11.6–15.2)

## 2013-10-09 LAB — RETICULOCYTES
RBC.: 2.61 MIL/uL — AB (ref 4.22–5.81)
Retic Count, Absolute: 31.3 10*3/uL (ref 19.0–186.0)
Retic Ct Pct: 1.2 % (ref 0.4–3.1)

## 2013-10-09 LAB — IRON AND TIBC
IRON: 30 ug/dL — AB (ref 42–135)
SATURATION RATIOS: 23 % (ref 20–55)
TIBC: 132 ug/dL — ABNORMAL LOW (ref 215–435)
UIBC: 102 ug/dL — AB (ref 125–400)

## 2013-10-09 LAB — FERRITIN

## 2013-10-09 LAB — CEA: CEA: 40.7 ng/mL — ABNORMAL HIGH (ref 0.0–5.0)

## 2013-10-09 LAB — CANCER ANTIGEN 19-9: CA 19-9: 49765.1 U/mL — ABNORMAL HIGH (ref <35.0)

## 2013-10-09 LAB — VITAMIN B12: Vitamin B-12: 856 pg/mL (ref 211–911)

## 2013-10-09 MED ORDER — IOHEXOL 300 MG/ML  SOLN
100.0000 mL | Freq: Once | INTRAMUSCULAR | Status: AC | PRN
  Administered 2013-10-09: 10:00:00 100 mL via INTRAVENOUS

## 2013-10-09 NOTE — Consult Note (Addendum)
[facility] Referring Provider: Dr. Inis Sizer Primary Care Physician:  No PCP Per Patient Primary Gastroenterologist:  None  Reason for Consultation: ? Need for colonoscopy, input about patients metastatic adenocarcinoma   HPI:  Gilbert Deleon is a 79 y.o. male admitted over a week ago with abd pain, severe anemia. [place] suggested murphy's sign but also numerous lesions in his liver.  Surgery was consulted who recommended evaluation of the liver lesions, also HIDA scan which was negative. Eventual biopsy of one of the very numerous liver masses showed "mucinous adenocarcinoma...favor pancreatico biliary primary."  Medical oncology was consulted, patient asked to be transferred to [facility] and that is in process.  I was consulted. A CT scan overnight showed that most of his liver is consumed by innumerable, clearly metastatic masses, there is also a large, 7cm mass in the tail of pancreas and there was question about rectosigmoid lesion (vs stool).  He had not heard the results of this CT scan yet, I explained it to him in the room.  Brother not around to discuss this with.    Past Medical History  Diagnosis Date  . Prostate cancer   . Multiple myeloma   . Hypertension     History reviewed. No pertinent past surgical history.  Prior to Admission medications   Medication Sig Start Date End Date Taking? Authorizing Provider  allopurinol (ZYLOPRIM) 100 MG tablet Take 100 mg by mouth daily.   Yes Historical Provider, MD  aspirin EC 325 MG tablet Take 325 mg by mouth daily.   Yes Historical Provider, MD  Calcium Carbonate-Vitamin D (CALCIUM 600+D) 600-400 MG-UNIT per tablet Take 1 tablet by mouth daily.   Yes Historical Provider, MD  Glucosamine-Chondroitin (GLUCOSAMINE CHONDR COMPLEX PO) Take 1 tablet by mouth daily.   Yes Historical Provider, MD  latanoprost (XALATAN) 0.005 % ophthalmic solution Place 1 drop into both eyes at bedtime.   Yes Historical Provider, MD  losartan (COZAAR) 25 MG  tablet Take 25 mg by mouth daily.   Yes Historical Provider, MD  Petrolatum-Zinc Oxide (SENSI-CARE PROTECTIVE BARRIER EX) Apply 1 application topically 2 (two) times daily.   Yes Historical Provider, MD  sennosides-docusate sodium (SENOKOT-S) 8.6-50 MG tablet Take 1 tablet by mouth 2 (two) times daily.   Yes Historical Provider, MD  spironolactone (ALDACTONE) 25 MG tablet Take 25 mg by mouth daily.   Yes Historical Provider, MD  terazosin (HYTRIN) 10 MG capsule Take 20 mg by mouth at bedtime.   Yes Historical Provider, MD  metoprolol (LOPRESSOR) 50 MG tablet Take 1.5 tablets (75 mg total) by mouth 2 (two) times daily. 10/07/13   Sheila Oats, MD    Current Facility-Administered Medications  Medication Dose Route Frequency Provider Last Rate Last Dose  . 0.9 %  sodium chloride infusion   Intravenous Continuous Sheila Oats, MD 30 mL/hr at 10/08/13 1754    . allopurinol (ZYLOPRIM) tablet 100 mg  100 mg Oral Daily Berle Mull, MD   100 mg at 10/09/13 0928  . antiseptic oral rinse (BIOTENE) solution 15 mL  15 mL Mouth Rinse BID Theodis Blaze, MD   15 mL at 10/08/13 2053  . aspirin EC tablet 325 mg  325 mg Oral Daily Berle Mull, MD   325 mg at 10/09/13 0928  . hydrALAZINE (APRESOLINE) injection 10 mg  10 mg Intravenous Q4H PRN Berle Mull, MD   10 mg at 10/07/13 1544  . losartan (COZAAR) tablet 25 mg  25 mg Oral Daily Adeline C Viyuoh,  MD   25 mg at 10/09/13 0928  . metoprolol tartrate (LOPRESSOR) tablet 75 mg  75 mg Oral BID Eugenie Filler, MD   75 mg at 10/09/13 5176  . senna-docusate (Senokot-S) tablet 1 tablet  1 tablet Oral BID Berle Mull, MD   1 tablet at 10/09/13 1230  . terazosin (HYTRIN) capsule 20 mg  20 mg Oral QHS Berle Mull, MD   20 mg at 10/08/13 2142    Allergies as of 10/11/2013  . (No Known Allergies)    Family History  Problem Relation Age of Onset  . Stomach cancer Mother   . Lung cancer Sister   . Bladder Cancer Brother     History   Social History   . Marital Status: Married    Spouse Name: N/A    Number of Children: N/A  . Years of Education: N/A   Occupational History  . Not on file.   Social History Main Topics  . Smoking status: Former Smoker -- 0.50 packs/day for 3 years    Types: Cigarettes    Quit date: 09/29/1958  . Smokeless tobacco: Not on file  . Alcohol Use: 2.5 oz/week    5 drink(s) per week  . Drug Use: No  . Sexual Activity: Not on file   Other Topics Concern  . Not on file   Social History Narrative  . No narrative on file     Review of Systems: Pertinent positive and negative review of systems were noted in the above HPI section. Complete review of systems was performed and was otherwise normal.   Physical Exam: Vital signs in last 24 hours: Temp:  [97.5 F (36.4 C)-98.6 F (37 C)] 98.3 F (36.8 C) (01/11 0500) Pulse Rate:  [74-100] 74 (01/11 0928) Resp:  [18] 18 (01/11 0500) BP: (130-167)/(68-84) 149/84 mmHg (01/11 0928) SpO2:  [98 %-99 %] 98 % (01/11 0500) Last BM Date: 10/08/13 Constitutional: obese, chronically ill appearing Psychiatric: alert and oriented x3 Eyes: extraocular movements intact Mouth: oral pharynx moist, no lesions Neck: supple no lymphadenopathy Cardiovascular: heart regular rate and rhythm Lungs: clear to auscultation bilaterally Abdomen: soft, nontender, nondistended, no obvious ascites, no peritoneal signs, normal bowel sounds Extremities: no lower extremity edema bilaterally Skin: no lesions on visible extremities Lab Results:  Recent Labs  10/09/13 0426  WBC 6.4  HGB 7.7*  HCT 23.9*  PLT 114*  MCV 91.6   BMET  Recent Labs  10/08/13 0403 10/09/13 0426  NA 139 137  K 4.6 4.6  CL 107 105  CO2 20 22  GLUCOSE 95 123*  BUN 25* 29*  CREATININE 1.20 1.30  CALCIUM 10.5 10.7*   LFT  Recent Labs  10/09/13 0426  BILITOT 1.4*  BILIDIR 0.8*  IBILI 0.6  AST 139*  ALT 94*  ALKPHOS 350*  PROT 6.9  ALBUMIN 2.4*   Imaging/Other Results: Ct  Abdomen Pelvis W Contrast  10/09/2013   CLINICAL DATA:  79 year old male with abdominal and pelvic pain. 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 Staging. Marland Kitchen  EXAM: CT ABDOMEN AND PELVIS WITH CONTRAST  TECHNIQUE: Multidetector CT imaging of the abdomen and pelvis was performed using the standard protocol following bolus administration of intravenous contrast.  CONTRAST:  184m OMNIPAQUE IOHEXOL 300 MG/ML  SOLN  COMPARISON:  10/03/2013 ultrasound and 10/17/2013 chest CT  FINDINGS: Cardiomegaly, tiny bilateral pleural effusions and left basilar atelectasis identified.  Innumerable masses throughout the liver are noted compatible with metastatic disease.  A 7.1 x 7.3  cm mass inseparable from the pancreatic tail and medial spleen identified.  The adrenal glands are unremarkable.  Bilateral renal cortical atrophy and right renal cysts are identified.  Small amount of ascites is present within the abdomen/pelvis.  No definite enlarged lymph nodes are identified. There is no evidence of abdominal aortic aneurysm or biliary dilatation.  A 5 x 7 x 11 cm filling defect along the posterior distal sigmoid colon/rectum is noted. Although this could represent slightly unusual appearing stool, a mass is not excluded and further evaluation/ direct inspection recommended.  No other bowel abnormalities are identified except for scattered colonic diverticular. There is no evidence of bowel obstruction, pneumoperitoneum or abscess.  Slight heterogeneity of the visualized bones noted and may be a reflection of multiple myeloma. Severe degenerative changes in both hips noted.  IMPRESSION: 7.1 x 7.3 cm mass situated at the pancreatic tail and splenic hilum. Question primary pancreatic neoplasm versus metastasis.  Innumerable hepatic metastases.  5 x 7 x 11 cm filling defect along the posterior distal sigmoid colon/rectum. Although this could represent slightly unusual appearing stool, further evaluation/direct  inspection is recommended to exclude colonic mass.  Slight bony heterogeneity which could be reflection of myeloma.  Cardiomegaly, tiny bilateral pleural effusions and left basilar atelectasis.   Electronically Signed   By: Hassan Rowan M.D.   On: 10/09/2013 11:15      Impression/Plan: 79 y.o. male with metastatic pancreatic adenocarcinoma originating in the tail of pancreas.  No GI workup is needed.  The "filling defect" along the distal sigmoid colon, rectum may be a drop met. Perhaps a second primary. Can be evaluated at [facility] with further testing if they feel necessary.  Prognosis from the pancreatic adeno is very poor, palliative chemo or hospice are both reasonable pathways.    Milus Banister, MD  10/09/2013, 1:46 PM [facility] Pager [phone]

## 2013-10-09 NOTE — Progress Notes (Signed)
Attempts to drink contrast at 0100 and 0300 ineffective. Pt awake this AM and began drinking contrast at 0630. After significant coaching pt has drank 1/2 of first cup. Will continue to encourage to drink and assist as needed. Darreld Mclean [facility]

## 2013-10-09 NOTE — Progress Notes (Signed)
Pt has been lethargic, but arousable throughout the day. When assessing pt's BP and giving meds Pt needs to be coached to remain awake and quickly falls back asleep. Pt needs assistance eating and when lunch trays arrived pt denied tray and fell back asleep. Dr. Dillard Essex made aware.

## 2013-10-09 NOTE — Plan of Care (Signed)
Problem: Phase II Progression Outcomes Goal: Other Phase II Outcomes/Goals Outcome: Completed/Met Date Met:  10/09/13 CPAP at HS

## 2013-10-09 NOTE — Progress Notes (Signed)
Patient placed on CPAP at this time. Tolerating well, RT will continue to monitor. 

## 2013-10-09 NOTE — Progress Notes (Addendum)
[facility] PROGRESS NOTE  Gilbert Deleon NLG:[id number] DOB: 1934-12-13 DOA: 10/17/2013 PCP: No PCP Per Patient  Assessment/Plan: #1 abdominal pain/mucinous Adenocarcinoma -Status post liver biopsy on 1/8 and results showing mucinous adeno and the features favor a primary pancreatobiliary adenocarcinoma.  -appreciate Dr. Calton Dach input, I contacted [facility] for transfer as per pt/family on 1/10 and pt accepted to Hospitalist service by Dr Gwenlyn Saran pending bed>> they emphasized thaht they have a long waiting list -above was discussed with pt and family and Dr Alvy Bimler and plan is to continue work up her pending bed and family is agreeable to this -CT of abd and pelvis shows 7.1 x 7.3 cm mass situated at the pancreatic tail and splenic hilum. Question primary pancreatic neoplasm vs Mets and a 5 x 7 x 11 cm filling defect along the posterior distal sigmoid colon/rectum -await bone scan - I discussed above findings with Pt/brother -GI consulted for further recs and pt seen by Dr Ardis Hughs and states that the large sigmoid/rectal filling defect is likely a drop mets vs second primary and recommends no further GI worK -Pt denies Further pain at this time, follow. -appreciate Onc assistance #2 acute respiratory distress Questionable etiology. Chest x-ray was negative for pneumonia. Influenza PCR was negative. Patient with clinical improvement. Blood cultures were no growth to date. VQ scan negative for PE. . Follow clinically. -Patient remaining or afebrile hemodynamically stable and respiratory status stable off abx  #3 abnormal LFTs Likely secondary to probable metastatic liver disease. Patient will likely need MRI MRCP open as outpatient for further evaluation.  -LFTs  trending down on 1/8>> recheck in a.m. -s/p ultrasound-guided biopsy of liver lesions today 1/8,  results as above #4 anemia of chronic disease H&H stable.   #5 acute renal failure Likely secondary to prerenal azotemia. . Renal  function improvement.  - Creatinine normalized,  IV fluids resumed prior to CT on 1/10 and continue Cozaar for BP control (resumed on 1/9) -see as discussed below -Creatinine remains stable #6 hypertension Continue Lopressor 75 mg twice daily. Continue Hytrin.  -BP control improving, continue Cozaar  #7 prophylaxis SCDs for DVT prophylaxis.  Code Status: Full Family Communication: Updated brother by phone Disposition Plan: Skilled nursing facility pending biopsy results   Consultants:  General surgery: Dr. Donne Hazel 10/02/2013  onc  GI   Procedures:  Chest x-ray 10/17/2013  Abdominal ultrasound 10/22/2013  CT angiogram of the chest 10/28/2013   HIDA Scan 10/02/2013  VQ scan 10/03/2013  Liver biopsy 1/8  CT abd and pelvis>>1/11  Diagnosis Liver, needle/core biopsy, Right lesion - MUCINOUS ADENOCARCINOMA. - SEE COMMENT. Microscopic Comment Given the radiologic findings, the features favor a primary pancreatobiliary adenocarcinoma. (JBK:ecj Antibiotics:  IV azithromycin 10/02/2013 >>> 10/02/2013  IV Rocephin 10/02/2013>>>> 10/02/2013  IV Zosyn 10/02/2013>>>> 10/04/2013  IV vancomycin 10/02/2013>>>> 10/04/2013  HPI/Subjective: Patient sleepy but easily arouses and denies pain. Objective: Filed Vitals:   10/09/13 0928  BP: 149/84  Pulse: 74  Temp:   Resp:     Intake/Output Summary (Last 24 hours) at 10/09/13 1722 Last data filed at 10/09/13 1300  Gross per 24 hour  Intake 1007.5 ml  Output    451 ml  Net  556.5 ml   Filed Weights   10/02/13 0252 10/02/13 0600  Weight: 127.007 kg (280 lb) 127.325 kg (280 lb 11.2 oz)    Exam:   General:  NAD  Cardiovascular: RRR  Respiratory: CTAB  Abdomen: Obese, soft, nontender, nondistended, positive bowel sounds.  Extremities: No clubbing cyanosis or  edema.  Data Reviewed: Basic Metabolic Panel:  Recent Labs Lab 10/04/13 0944 10/05/13 0512 10/06/13 0650 10/08/13 0403 10/09/13 0426  NA  138 139 138 139 137  K 4.5 4.2 4.5 4.6 4.6  CL 105 106 106 107 105  CO2 _0 GLUCOSE 91 103* 99 95 123*  BUN 25* 25* 19 25* 29*  CREATININE 1.29 1.24 1.06 1.20 1.30  CALCIUM 9.8 9.9 10.0 10.5 10.7*   Liver Function Tests:  Recent Labs Lab 10/04/13 0944 10/05/13 0512 10/06/13 0650 10/08/13 0403 10/09/13 0426  AST 130* 153* 142* 107* 139*  ALT 94* 109* 100* 82* 94*  ALKPHOS 282* 306* 303* 282* 350*  BILITOT 1.9* 1.8* 1.4* 1.4* 1.4*  PROT 6.9 7.3 6.8 6.9 6.9  ALBUMIN 2.4* 2.5* 2.4* 2.4* 2.4*   No results found for this basename: LIPASE, AMYLASE,  in the last 168 hours No results found for this basename: AMMONIA,  in the last 168 hours CBC:  Recent Labs Lab 10/03/13 0632 10/04/13 0944 10/05/13 0512 10/06/13 0650 10/09/13 0426  WBC 6.4 5.9 6.1 5.6 6.4  NEUTROABS  --  4.1  --   --  5.0  HGB 8.5* 8.2* 8.1* 8.0* 7.7*  HCT 25.8* 24.6* 25.1* 23.5* 23.9*  MCV 89.6 89.5 90.9 90.7 91.6  PLT 141* 125* 116* 106* 114*   Cardiac Enzymes: No results found for this basename: CKTOTAL, CKMB, CKMBINDEX, TROPONINI,  in the last 168 hours BNP (last 3 results) No results found for this basename: PROBNP,  in the last 8760 hours CBG: No results found for this basename: GLUCAP,  in the last 168 hours  Recent Results (from the past 240 hour(s))  CULTURE, BLOOD (ROUTINE X 2)     Status: None   Collection Time    10/02/13  8:40 AM      Result Value Range Status   Specimen Description BLOOD LEFT ARM   Final   Special Requests BOTTLES DRAWN AEROBIC ONLY 10CC   Final   Culture  Setup Time     Final   Value: 10/02/2013 16:12     Performed at [facility]   Culture     Final   Value: NO GROWTH 5 DAYS     Performed at [facility]   Report Status 10/08/2013 FINAL   Final  CULTURE, BLOOD (ROUTINE X 2)     Status: None   Collection Time    10/02/13  8:45 AM      Result Value Range Status   Specimen Description BLOOD LEFT HAND   Final   Special Requests BOTTLES  DRAWN AEROBIC ONLY 10CC   Final   Culture  Setup Time     Final   Value: 10/02/2013 16:13     Performed at [facility]   Culture     Final   Value: NO GROWTH 5 DAYS     Performed at [facility]   Report Status 10/08/2013 FINAL   Final     Studies: Ct Abdomen Pelvis W Contrast  10/09/2013   CLINICAL DATA:  79 year old male with abdominal and pelvic pain. 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 Staging. Marland Kitchen  EXAM: CT ABDOMEN AND PELVIS WITH CONTRAST  TECHNIQUE: Multidetector CT imaging of the abdomen and pelvis was performed using the standard protocol following bolus administration of intravenous contrast.  CONTRAST:  187m OMNIPAQUE IOHEXOL 300 MG/ML  SOLN  COMPARISON:  10/23/2013 ultrasound and 10/22/2013 chest CT  FINDINGS:  Cardiomegaly, tiny bilateral pleural effusions and left basilar atelectasis identified.  Innumerable masses throughout the liver are noted compatible with metastatic disease.  A 7.1 x 7.3 cm mass inseparable from the pancreatic tail and medial spleen identified.  The adrenal glands are unremarkable.  Bilateral renal cortical atrophy and right renal cysts are identified.  Small amount of ascites is present within the abdomen/pelvis.  No definite enlarged lymph nodes are identified. There is no evidence of abdominal aortic aneurysm or biliary dilatation.  A 5 x 7 x 11 cm filling defect along the posterior distal sigmoid colon/rectum is noted. Although this could represent slightly unusual appearing stool, a mass is not excluded and further evaluation/ direct inspection recommended.  No other bowel abnormalities are identified except for scattered colonic diverticular. There is no evidence of bowel obstruction, pneumoperitoneum or abscess.  Slight heterogeneity of the visualized bones noted and may be a reflection of multiple myeloma. Severe degenerative changes in both hips noted.  IMPRESSION: 7.1 x 7.3 cm mass situated at the pancreatic  tail and splenic hilum. Question primary pancreatic neoplasm versus metastasis.  Innumerable hepatic metastases.  5 x 7 x 11 cm filling defect along the posterior distal sigmoid colon/rectum. Although this could represent slightly unusual appearing stool, further evaluation/direct inspection is recommended to exclude colonic mass.  Slight bony heterogeneity which could be reflection of myeloma.  Cardiomegaly, tiny bilateral pleural effusions and left basilar atelectasis.   Electronically Signed   By: Hassan Rowan M.D.   On: 10/09/2013 11:15    Scheduled Meds: . allopurinol  100 mg Oral Daily  . antiseptic oral rinse  15 mL Mouth Rinse BID  . aspirin EC  325 mg Oral Daily  . losartan  25 mg Oral Daily  . metoprolol  75 mg Oral BID  . senna-docusate  1 tablet Oral BID  . terazosin  20 mg Oral QHS   Continuous Infusions: . sodium chloride 30 mL/hr at 10/08/13 1754    Principal Problem:   Abdominal pain, unspecified site Active Problems:   Multiple myeloma   Prostate cancer   Anemia   Renal failure   Liver mass   Splenic mass   Hypertension   Pancreatic cancer    Time spent: 25 minutes    Sheila Oats M.D. [facility] Pager [phone]. If 7PM-7AM, please contact night-coverage at [email or web address], password [facility] 10/09/2013, 5:22 PM  LOS: 8 days

## 2013-10-09 NOTE — Clinical Social Work Note (Signed)
Per MD patient is not ready for DC. Per MD patient plans to be moved to [facility] when bed is available. CSW will continue to follow if disposition changes.   Liz Beach, [place], [place], [phone]

## 2013-10-10 ENCOUNTER — Inpatient Hospital Stay (HOSPITAL_COMMUNITY): Payer: Medicare Other

## 2013-10-10 ENCOUNTER — Encounter (HOSPITAL_COMMUNITY): Payer: Medicare Other | Attending: Internal Medicine

## 2013-10-10 DIAGNOSIS — D375 Neoplasm of uncertain behavior of rectum: Secondary | ICD-10-CM

## 2013-10-10 DIAGNOSIS — D371 Neoplasm of uncertain behavior of stomach: Secondary | ICD-10-CM

## 2013-10-10 DIAGNOSIS — D378 Neoplasm of uncertain behavior of other specified digestive organs: Secondary | ICD-10-CM

## 2013-10-10 LAB — URINALYSIS, ROUTINE W REFLEX MICROSCOPIC
Bilirubin Urine: NEGATIVE
Glucose, UA: NEGATIVE mg/dL
Hgb urine dipstick: NEGATIVE
KETONES UR: NEGATIVE mg/dL
Leukocytes, UA: NEGATIVE
NITRITE: NEGATIVE
Protein, ur: NEGATIVE mg/dL
Specific Gravity, Urine: 1.026 (ref 1.005–1.030)
Urobilinogen, UA: 0.2 mg/dL (ref 0.0–1.0)
pH: 5 (ref 5.0–8.0)

## 2013-10-10 LAB — CBC
HCT: 24 % — ABNORMAL LOW (ref 39.0–52.0)
HEMOGLOBIN: 7.7 g/dL — AB (ref 13.0–17.0)
MCH: 29.6 pg (ref 26.0–34.0)
MCHC: 32.1 g/dL (ref 30.0–36.0)
MCV: 92.3 fL (ref 78.0–100.0)
PLATELETS: 100 10*3/uL — AB (ref 150–400)
RBC: 2.6 MIL/uL — AB (ref 4.22–5.81)
RDW: 19.1 % — ABNORMAL HIGH (ref 11.5–15.5)
WBC: 6 10*3/uL (ref 4.0–10.5)

## 2013-10-10 LAB — AMMONIA: Ammonia: 44 umol/L (ref 11–60)

## 2013-10-10 MED ORDER — IBUPROFEN 400 MG PO TABS
400.0000 mg | ORAL_TABLET | Freq: Once | ORAL | Status: AC
  Administered 2013-10-10: 08:00:00 400 mg via ORAL
  Filled 2013-10-10: qty 1

## 2013-10-10 MED ORDER — SODIUM CHLORIDE 0.9 % IV SOLN: Freq: Once | INTRAVENOUS | Status: DC

## 2013-10-10 MED ORDER — BOOST / RESOURCE BREEZE PO LIQD
1.0000 | Freq: Two times a day (BID) | ORAL | Status: DC
  Administered 2013-10-10 – 2013-10-17 (×13): 1 via ORAL

## 2013-10-10 MED ORDER — DEXAMETHASONE SODIUM PHOSPHATE 4 MG/ML IJ SOLN
4.0000 mg | Freq: Two times a day (BID) | INTRAMUSCULAR | Status: DC
  Administered 2013-10-10 – 2013-10-14 (×8): 4 mg via INTRAVENOUS
  Filled 2013-10-10 (×9): qty 1

## 2013-10-10 MED ORDER — TECHNETIUM TC 99M MEDRONATE IV KIT
25.0000 | PACK | Freq: Once | INTRAVENOUS | Status: AC | PRN
  Administered 2013-10-10: 09:00:00 25 via INTRAVENOUS

## 2013-10-10 MED ORDER — SODIUM CHLORIDE 0.9 % IV SOLN
60.0000 mg | Freq: Once | INTRAVENOUS | Status: AC
  Administered 2013-10-10: 60 mg via INTRAVENOUS
  Filled 2013-10-10: qty 20

## 2013-10-10 NOTE — Progress Notes (Signed)
PT Cancellation Note  Patient Details Name: Gilbert Deleon MRN: [id number] DOB: December 31, 1934   Cancelled Treatment:    Reason Eval/Treat Not Completed: Fatigue limiting ability to participate. Pt pleasantly asked to defer until tomorrow.   Abigal Choung 10/10/2013, 10:43 AM

## 2013-10-10 NOTE — Progress Notes (Signed)
[facility] PROGRESS NOTE  Gilbert Deleon CZY:[id number] DOB: [birth year] DOA: 10/28/2013 PCP: No PCP Per Patient  Assessment/Plan: #1 abdominal pain/mucinous Adenocarcinoma -Status post liver biopsy on 1/8 and results showing mucinous adeno and the features favor a primary pancreatobiliary adenocarcinoma.  -appreciate Dr. Calton Dach input, I contacted [facility] for transfer as per pt/family on 1/10 and pt accepted to Hospitalist service by Dr Gwenlyn Saran pending bed>> they emphasized thaht they have a long waiting list -above was discussed with pt and family and Dr Alvy Bimler and plan is to continue work up her pending bed and family is agreeable to this -CT of abd and pelvis shows 7.1 x 7.3 cm mass situated at the pancreatic tail and splenic hilum. Question primary pancreatic neoplasm vs Mets and a 5 x 7 x 11 cm filling defect along the posterior distal sigmoid colon/rectum -await bone scan - I discussed above findings with Pt/brother -GI consulted for further recs and pt seen by Dr Ardis Hughs and states that the large sigmoid/rectal filling defect is likely a drop mets vs second primary and recommends no further GI worK -Pt denies Further pain at this time, follow. -appreciate Onc assistance #2Fever - cxr with worsening of interstitial edema or interstitial infiltrates -UA negative -Obtain blood cultures and start on empiric antibiotics for HAP -Decrease IVF follow -His abdomen is nontender and nondistended #3 acute respiratory distress-on admission now resolved Questionable etiology. Chest x-ray was negative for pneumonia. Influenza PCR was negative. Patient with clinical improvement. Blood cultures were no growth to date. -Repeating blood cultures as above. #4 abnormal LFTs Likely secondary to probable metastatic liver disease. -s/p ultrasound-guided biopsy of liver lesions today 1/8,  results as above -Recheck LFTs in am #5 anemia of chronic disease H&H stable.  -Recheck H&H and transfuse as  appropriate  #6 acute renal failure Likely secondary to prerenal azotemia. . Renal function improvement.  - Creatinine normalized,  IV fluids resumed prior to CT on 1/10 and continue Cozaar for BP control (resumed on 1/9) -see as discussed below -Creatinine remains stable follow and recheck #7 hypertension Continue Lopressor 75 mg twice daily. Continue Hytrin.  -continue Cozaar #8hypercalacemia -Discussed with oncology>>Dr Alvy Bimler is starting on  IV pamidronate and steroid #9prophylaxis SCDs for DVT prophylaxis. #10 acute encephalopathy-metabolic+/-toxic -Likely due to hypercalcemia and possible infection  Code Status: Full Family Communication: Updated brother by phone Disposition Plan: Skilled nursing facility pending biopsy results   Consultants:  General surgery: Dr. Donne Hazel 10/02/2013  onc  GI   Procedures:  Chest x-ray 10/28/2013  Abdominal ultrasound 10/29/2013  CT angiogram of the chest 10/06/2013   HIDA Scan 10/02/2013  VQ scan 10/03/2013  Liver biopsy 1/8  CT abd and pelvis>>1/11  Diagnosis Liver, needle/core biopsy, Right lesion - MUCINOUS ADENOCARCINOMA. - SEE COMMENT. Microscopic Comment Given the radiologic findings, the features favor a primary pancreatobiliary adenocarcinoma. (JBK:ecj Antibiotics:  IV azithromycin 10/02/2013 >>> 10/02/2013  IV Rocephin 10/02/2013>>>> 10/02/2013  IV Zosyn 10/02/2013>>>> 10/04/2013  IV vancomycin 10/02/2013>>>> 10/04/2013  HPI/Subjective: Patient sleepy but easily arouses . Objective: Filed Vitals:   10/10/13 1455  BP: 124/71  Pulse: 66  Temp: 97.7 F (36.5 C)  Resp: 20    Intake/Output Summary (Last 24 hours) at 10/10/13 1623 Last data filed at 10/10/13 1500  Gross per 24 hour  Intake    120 ml  Output      0 ml  Net    120 ml   Filed Weights   10/02/13 0252 10/02/13 0600  Weight: 127.007 kg (280 lb)  127.325 kg (280 lb 11.2 oz)    Exam:   General:  Lethargic, arouses momentarily  to deep touch  Cardiovascular: RRR  Respiratory: CTAB  Abdomen: Obese, soft, nontender, nondistended, positive bowel sounds.  Extremities: No clubbing cyanosis or edema.  Data Reviewed: Basic Metabolic Panel:  Recent Labs Lab 10/04/13 0944 10/05/13 0512 10/06/13 0650 10/08/13 0403 10/09/13 0426  NA 138 139 138 139 137  K 4.5 4.2 4.5 4.6 4.6  CL 105 106 106 107 105  CO2 22 21 19 20 22   GLUCOSE 91 103* 99 95 123*  BUN 25* 25* 19 25* 29*  CREATININE 1.29 1.24 1.06 1.20 1.30  CALCIUM 9.8 9.9 10.0 10.5 10.7*   Liver Function Tests:  Recent Labs Lab 10/04/13 0944 10/05/13 0512 10/06/13 0650 10/08/13 0403 10/09/13 0426  AST 130* 153* 142* 107* 139*  ALT 94* 109* 100* 82* 94*  ALKPHOS 282* 306* 303* 282* 350*  BILITOT 1.9* 1.8* 1.4* 1.4* 1.4*  PROT 6.9 7.3 6.8 6.9 6.9  ALBUMIN 2.4* 2.5* 2.4* 2.4* 2.4*   No results found for this basename: LIPASE, AMYLASE,  in the last 168 hours No results found for this basename: AMMONIA,  in the last 168 hours CBC:  Recent Labs Lab 10/04/13 0944 10/05/13 0512 10/06/13 0650 10/09/13 0426  WBC 5.9 6.1 5.6 6.4  NEUTROABS 4.1  --   --  5.0  HGB 8.2* 8.1* 8.0* 7.7*  HCT 24.6* 25.1* 23.5* 23.9*  MCV 89.5 90.9 90.7 91.6  PLT 125* 116* 106* 114*   Cardiac Enzymes: No results found for this basename: CKTOTAL, CKMB, CKMBINDEX, TROPONINI,  in the last 168 hours BNP (last 3 results) No results found for this basename: PROBNP,  in the last 8760 hours CBG: No results found for this basename: GLUCAP,  in the last 168 hours  Recent Results (from the past 240 hour(s))  CULTURE, BLOOD (ROUTINE X 2)     Status: None   Collection Time    10/02/13  8:40 AM      Result Value Range Status   Specimen Description BLOOD LEFT ARM   Final   Special Requests BOTTLES DRAWN AEROBIC ONLY 10CC   Final   Culture  Setup Time     Final   Value: 10/02/2013 16:12     Performed at [facility]     Final   Value: NO GROWTH 5 DAYS      Performed at [facility]   Report Status 10/08/2013 FINAL   Final  CULTURE, BLOOD (ROUTINE X 2)     Status: None   Collection Time    10/02/13  8:45 AM      Result Value Range Status   Specimen Description BLOOD LEFT HAND   Final   Special Requests BOTTLES DRAWN AEROBIC ONLY 10CC   Final   Culture  Setup Time     Final   Value: 10/02/2013 16:13     Performed at [facility]     Final   Value: NO GROWTH 5 DAYS     Performed at [facility]   Report Status 10/08/2013 FINAL   Final     Studies: Nm Bone Scan Whole Body  10/10/2013   CLINICAL DATA:  Liver metastases  EXAM: NUCLEAR MEDICINE WHOLE BODY BONE SCAN  TECHNIQUE: Whole body anterior and posterior images were obtained approximately 3 hours after intravenous injection of radiopharmaceutical.  COMPARISON:  US BIOPSY dated 10/06/2013; NM HEPATOBILIARY  INCLUDE GB dated 10/02/2013; CT ABD/PELVIS W CM dated 10/09/2013  RADIOPHARMACEUTICALS:  Twenty-five 0 Technetium-99 MDP  FINDINGS: Patient rotated leftward. No abnormal accumulation of radiotracer within the axillary or appendicular skeleton to suggest metastasis. Inferior sacrum is obscured by radiotracer in the bladder. Symmetric degenerate uptake noted proximal shoulders and sternoclavicular joints.  IMPRESSION: No scintigraphic evidence skeletal metastasis.   Electronically Signed   By: Suzy Bouchard M.D.   On: 10/10/2013 14:39   Ct Abdomen Pelvis W Contrast  10/09/2013   CLINICAL DATA:  79 year old male with abdominal and pelvic pain. 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 Staging. Marland Kitchen  EXAM: CT ABDOMEN AND PELVIS WITH CONTRAST  TECHNIQUE: Multidetector CT imaging of the abdomen and pelvis was performed using the standard protocol following bolus administration of intravenous contrast.  CONTRAST:  127m OMNIPAQUE IOHEXOL 300 MG/ML  SOLN  COMPARISON:  09/30/2013 ultrasound and 09/30/2013 chest CT  FINDINGS: Cardiomegaly, tiny  bilateral pleural effusions and left basilar atelectasis identified.  Innumerable masses throughout the liver are noted compatible with metastatic disease.  A 7.1 x 7.3 cm mass inseparable from the pancreatic tail and medial spleen identified.  The adrenal glands are unremarkable.  Bilateral renal cortical atrophy and right renal cysts are identified.  Small amount of ascites is present within the abdomen/pelvis.  No definite enlarged lymph nodes are identified. There is no evidence of abdominal aortic aneurysm or biliary dilatation.  A 5 x 7 x 11 cm filling defect along the posterior distal sigmoid colon/rectum is noted. Although this could represent slightly unusual appearing stool, a mass is not excluded and further evaluation/ direct inspection recommended.  No other bowel abnormalities are identified except for scattered colonic diverticular. There is no evidence of bowel obstruction, pneumoperitoneum or abscess.  Slight heterogeneity of the visualized bones noted and may be a reflection of multiple myeloma. Severe degenerative changes in both hips noted.  IMPRESSION: 7.1 x 7.3 cm mass situated at the pancreatic tail and splenic hilum. Question primary pancreatic neoplasm versus metastasis.  Innumerable hepatic metastases.  5 x 7 x 11 cm filling defect along the posterior distal sigmoid colon/rectum. Although this could represent slightly unusual appearing stool, further evaluation/direct inspection is recommended to exclude colonic mass.  Slight bony heterogeneity which could be reflection of myeloma.  Cardiomegaly, tiny bilateral pleural effusions and left basilar atelectasis.   Electronically Signed   By: JHassan RowanM.D.   On: 10/09/2013 11:15   Dg Chest Port 1 View  10/10/2013   CLINICAL DATA:  Fever and hypertension  EXAM: PORTABLE CHEST - 1 VIEW  COMPARISON:  Chest x-ray of October 01, 2012  FINDINGS: The lungs remain hypoinflated. The interstitial markings are more prominent today than on the previous  study. The coarse lung markings in the right infrahilar region are not well demonstrated due to the adjacent right hemidiaphragm. The cardiac silhouette remains enlarged. The central pulmonary vascularity is mildly prominent but cephalization is not demonstrated. The left hemidiaphragm is partially obscured today.  IMPRESSION: The findings suggest worsening of interstitial edema or interstitial infiltrates. There is partial obscuration of the left hemidiaphragm which may reflect atelectasis or adjacent pleural effusion. A followup PA and lateral chest x-ray with deep inspiration would be of value.   Electronically Signed   By: David  JMartinique  On: 10/10/2013 10:06    Scheduled Meds: . sodium chloride   Intravenous Once  . allopurinol  100 mg Oral Daily  . antiseptic oral rinse  15 mL Mouth  Rinse BID  . aspirin EC  325 mg Oral Daily  . feeding supplement (RESOURCE BREEZE)  1 Container Oral BID BM  . losartan  25 mg Oral Daily  . metoprolol  75 mg Oral BID  . senna-docusate  1 tablet Oral BID  . terazosin  20 mg Oral QHS   Continuous Infusions:    Principal Problem:   Abdominal pain, unspecified site Active Problems:   Multiple myeloma   Prostate cancer   Anemia   Renal failure   Liver mass   Splenic mass   Hypertension   Pancreatic cancer    Time spent: 35 minutes    Sheila Oats M.D. [facility] Pager [phone]. If 7PM-7AM, please contact night-coverage at [email or web address], password [facility] 10/10/2013, 4:23 PM  LOS: 9 days

## 2013-10-10 NOTE — Progress Notes (Signed)
NUTRITION FOLLOW-UP  DOCUMENTATION CODES Per approved criteria  -Obesity Unspecified   INTERVENTION: Encouraged adequate intake of foods and beverages. Agree with Regular diet. Resource Breeze po BID, each supplement provides 250 kcal and 9 grams of protein. RD to continue to follow nutrition care plan.  NUTRITION DIAGNOSIS: Inadequate oral intake related to variable appetite as evidenced by pt report. Ongoing.  Goal: Intake to meet >90% of estimated nutrition needs. Unmet  Monitor:  weight trends, lab trends, I/O's, PO intake, supplement tolerance  ASSESSMENT: PMHx significant for multiple myeloma, prostate cancer. Admitted with SOB x 2-3 months and abdominal pain x 1-2 months. Work-up reveals acute cholecystitis. Per surgery team, no surgery warranted at this time.  Underwent liver biopsy on 1/8, which revealed primary pancreatobiliary adenocarcinoma.  [facility] has been contacted for transfer per patient and family request.  Pt is eating very little of meals. Unable to answer my questions directly, states that he has a good appetite but his breakfast tray was untouched this morning.  Height: Ht Readings from Last 1 Encounters:  10/02/13 6' 3"  (1.905 m)    Weight: Wt Readings from Last 1 Encounters:  10/02/13 280 lb 11.2 oz (127.325 kg)   BMI:  Body mass index is 35.09 kg/(m^2). Obese Class II  Estimated Nutritional Needs: Kcal: 2050 - 2200 Protein: 90 - 100 g Fluid: approx 2 - 2.2 liters daily  Skin: intact  Diet Order: General  EDUCATION NEEDS: -No education needs identified at this time   Intake/Output Summary (Last 24 hours) at 10/10/13 1256 Last data filed at 10/10/13 0906  Gross per 24 hour  Intake    160 ml  Output      0 ml  Net    160 ml    Last BM: 1/10  Labs:   Recent Labs Lab 10/06/13 0650 10/08/13 0403 10/09/13 0426  NA 138 139 137  K 4.5 4.6 4.6  CL 106 107 105  CO2 19 20 22   BUN 19 25* 29*  CREATININE 1.06 1.20 1.30  CALCIUM 10.0  10.5 10.7*  GLUCOSE 99 95 123*    CBG (last 3)  No results found for this basename: GLUCAP,  in the last 72 hours  Scheduled Meds: . allopurinol  100 mg Oral Daily  . antiseptic oral rinse  15 mL Mouth Rinse BID  . aspirin EC  325 mg Oral Daily  . losartan  25 mg Oral Daily  . metoprolol  75 mg Oral BID  . senna-docusate  1 tablet Oral BID  . terazosin  20 mg Oral QHS    Continuous Infusions: . sodium chloride 30 mL/hr at 10/09/13 2152    Inda Coke MS, RD, [place] Pager: [phone] After-hours pager: [phone]

## 2013-10-10 NOTE — Progress Notes (Signed)
Pt. Was placed on CPAP 7cm H2O via FFM (what pt. Wears at home) Pt. Is tolerating CPAP well at this time without any complications.
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
## 2013-10-10 NOTE — Progress Notes (Signed)
Gilbert Deleon   DOB:1935-05-16   LK#:[id number]    Subjective: The patient denies any pain. He is not alert. He is arousable at times. He denies any discomfort. According to staff members, there were no report of bleeding.  Objective:  Filed Vitals:   10/10/13 1455  BP: 124/71  Pulse: 66  Temp: 97.7 F (36.5 C)  Resp: 20     Intake/Output Summary (Last 24 hours) at 10/10/13 1638 Last data filed at 10/10/13 1500  Gross per 24 hour  Intake    120 ml  Output      0 ml  Net    120 ml    GENERAL: Patient is sleeping, no distress and comfortable SKIN: skin color, texture, turgor are normal, no rashes or significant lesions NECK: supple, thyroid normal size, non-tender, without nodularity LYMPH:  no palpable lymphadenopathy in the cervical, axillary or inguinal LUNGS: clear to auscultation and percussion with normal breathing effort HEART: regular rate & rhythm and no murmurs. Moderate bilateral lower extremity edema  ABDOMEN:abdomen soft, non-tender and normal bowel sounds Musculoskeletal:no cyanosis of digits and no clubbing  Labs:  Lab Results  Component Value Date   WBC 6.4 10/09/2013   HGB 7.7* 10/09/2013   HCT 23.9* 10/09/2013   MCV 91.6 10/09/2013   PLT 114* 10/09/2013   NEUTROABS 5.0 10/09/2013    Lab Results  Component Value Date   NA 137 10/09/2013   K 4.6 10/09/2013   CL 105 10/09/2013   CO2 22 10/09/2013    Studies:  Nm Bone Scan Whole Body  10/10/2013   CLINICAL DATA:  Liver metastases  EXAM: NUCLEAR MEDICINE WHOLE BODY BONE SCAN  TECHNIQUE: Whole body anterior and posterior images were obtained approximately 3 hours after intravenous injection of radiopharmaceutical.  COMPARISON:  US BIOPSY dated 10/06/2013; NM HEPATOBILIARY INCLUDE GB dated 10/02/2013; CT ABD/PELVIS W CM dated 10/09/2013  RADIOPHARMACEUTICALS:  Twenty-five 0 Technetium-99 MDP  FINDINGS: Patient rotated leftward. No abnormal accumulation of radiotracer within the axillary or appendicular skeleton to suggest  metastasis. Inferior sacrum is obscured by radiotracer in the bladder. Symmetric degenerate uptake noted proximal shoulders and sternoclavicular joints.  IMPRESSION: No scintigraphic evidence skeletal metastasis.   Electronically Signed   By: Suzy Bouchard M.D.   On: 10/10/2013 14:39   Ct Abdomen Pelvis W Contrast  10/09/2013   CLINICAL DATA:  79 year old male with abdominal and pelvic pain. 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 Staging. Marland Kitchen  EXAM: CT ABDOMEN AND PELVIS WITH CONTRAST  TECHNIQUE: Multidetector CT imaging of the abdomen and pelvis was performed using the standard protocol following bolus administration of intravenous contrast.  CONTRAST:  152m OMNIPAQUE IOHEXOL 300 MG/ML  SOLN  COMPARISON:  10/16/2013 ultrasound and 10/18/2013 chest CT  FINDINGS: Cardiomegaly, tiny bilateral pleural effusions and left basilar atelectasis identified.  Innumerable masses throughout the liver are noted compatible with metastatic disease.  A 7.1 x 7.3 cm mass inseparable from the pancreatic tail and medial spleen identified.  The adrenal glands are unremarkable.  Bilateral renal cortical atrophy and right renal cysts are identified.  Small amount of ascites is present within the abdomen/pelvis.  No definite enlarged lymph nodes are identified. There is no evidence of abdominal aortic aneurysm or biliary dilatation.  A 5 x 7 x 11 cm filling defect along the posterior distal sigmoid colon/rectum is noted. Although this could represent slightly unusual appearing stool, a mass is not excluded and further evaluation/ direct inspection recommended.  No other bowel abnormalities are identified  except for scattered colonic diverticular. There is no evidence of bowel obstruction, pneumoperitoneum or abscess.  Slight heterogeneity of the visualized bones noted and may be a reflection of multiple myeloma. Severe degenerative changes in both hips noted.  IMPRESSION: 7.1 x 7.3 cm mass situated at the  pancreatic tail and splenic hilum. Question primary pancreatic neoplasm versus metastasis.  Innumerable hepatic metastases.  5 x 7 x 11 cm filling defect along the posterior distal sigmoid colon/rectum. Although this could represent slightly unusual appearing stool, further evaluation/direct inspection is recommended to exclude colonic mass.  Slight bony heterogeneity which could be reflection of myeloma.  Cardiomegaly, tiny bilateral pleural effusions and left basilar atelectasis.   Electronically Signed   By: Hassan Rowan M.D.   On: 10/09/2013 11:15   Dg Chest Port 1 View  10/10/2013   CLINICAL DATA:  Fever and hypertension  EXAM: PORTABLE CHEST - 1 VIEW  COMPARISON:  Chest x-ray of October 01, 2012  FINDINGS: The lungs remain hypoinflated. The interstitial markings are more prominent today than on the previous study. The coarse lung markings in the right infrahilar region are not well demonstrated due to the adjacent right hemidiaphragm. The cardiac silhouette remains enlarged. The central pulmonary vascularity is mildly prominent but cephalization is not demonstrated. The left hemidiaphragm is partially obscured today.  IMPRESSION: The findings suggest worsening of interstitial edema or interstitial infiltrates. There is partial obscuration of the left hemidiaphragm which may reflect atelectasis or adjacent pleural effusion. A followup PA and lateral chest x-ray with deep inspiration would be of value.   Electronically Signed   By: David  Martinique   On: 10/10/2013 10:06    Assessment & Plan:  #1 metastatic pancreatic cancer The patient's performance status is very poor. He is not a candidate for systemic chemotherapy. I call his wife who is supposed to be his medical power of attorney. I told her that there is nothing I can offer. Prognosis for stage IV metastatic per pancreatic cancer is poor. She will be arriving to [facility] on Wednesday and she once him to be transferred elsewhere for treatment. I  discussed this case with her primary the hospitalist and I recommend palliative care consult #2 Mass in the colon I reviewed the CT scan in great detail and agreed that is abnormalities in the distal colonic region. The patient had strong family history of cancer as well as personal history of multiple cancers. It is possible he may have undiagnosed colon cancer as well. Given the scope of his problems and absence of signs and symptoms of bowel obstruction, I think he can be monitored without going through a colonoscopy.   #3 history of multiple myeloma Further testing is in progress. The patient has evidence of hypercalcemia.  #4 hypercalcemia This is malignant. I recommend gentle hydration, low-dose dexamethasone as well as IV bisphosphonates   #5 severe anemia This is likely combination of anemia chronic disease and from malignancy. Neoplastic infiltration of bone marrow cannot be excluded. I recommend consideration for repeat blood transfusion. The patient appeared to be quite sedated and it could be a sign of severe anemia #6 history of prostate cancer His PSA were within normal limits #7 poor performance status I recommend continue supportive care. #8 poor social circumstances When I first met the patient on Saturday, he appears to want his brother to be involved in all medical decision. However, talking to his wife today, his wife stated that she is a medical power of attorney and does not  want his brother to be involved. Social worker has been consulted for this.  I will sign off. Please hesitate to contact me if questions arise.  [facility], [place], MD 10/10/2013  4:38 PM

## 2013-10-10 NOTE — Clinical Social Work Note (Addendum)
CSW spoke with brother who states that patient has an estranged wife. Brother states that wife has not been involved in patient's life for a long time. Wife is apparently requesting that patient be transferred to hospital in [place]. Brother is requesting patient be transferred to [facility].   Liz [place], [place], [place], [phone]

## 2013-10-11 LAB — PROTEIN ELECTROPHORESIS, SERUM
ALPHA-1-GLOBULIN: 8.6 % — AB (ref 2.9–4.9)
ALPHA-2-GLOBULIN: 10.5 % (ref 7.1–11.8)
Albumin ELP: 42.8 % — ABNORMAL LOW (ref 55.8–66.1)
BETA GLOBULIN: 3.9 % — AB (ref 4.7–7.2)
Beta 2: 4 % (ref 3.2–6.5)
Gamma Globulin: 30.2 % — ABNORMAL HIGH (ref 11.1–18.8)
M-Spike, %: 1.52 g/dL
TOTAL PROTEIN ELP: 6.5 g/dL (ref 6.0–8.3)

## 2013-10-11 LAB — IMMUNOFIXATION ELECTROPHORESIS
IGA: 82 mg/dL (ref 68–379)
IGG (IMMUNOGLOBIN G), SERUM: 2150 mg/dL — AB (ref 650–1600)
IGM, SERUM: 27 mg/dL — AB (ref 41–251)
TOTAL PROTEIN ELP: 6.5 g/dL (ref 6.0–8.3)

## 2013-10-11 LAB — COMPREHENSIVE METABOLIC PANEL
ALT: 101 U/L — ABNORMAL HIGH (ref 0–53)
AST: 154 U/L — ABNORMAL HIGH (ref 0–37)
Albumin: 2.4 g/dL — ABNORMAL LOW (ref 3.5–5.2)
Alkaline Phosphatase: 378 U/L — ABNORMAL HIGH (ref 39–117)
BILIRUBIN TOTAL: 1.4 mg/dL — AB (ref 0.3–1.2)
BUN: 33 mg/dL — ABNORMAL HIGH (ref 6–23)
CHLORIDE: 107 meq/L (ref 96–112)
CO2: 21 meq/L (ref 19–32)
CREATININE: 1.23 mg/dL (ref 0.50–1.35)
Calcium: 10.9 mg/dL — ABNORMAL HIGH (ref 8.4–10.5)
GFR calc Af Amer: 64 mL/min — ABNORMAL LOW (ref >90)
GFR, EST NON AFRICAN AMERICAN: 55 mL/min — AB (ref >90)
Glucose, Bld: 128 mg/dL — ABNORMAL HIGH (ref 70–99)
Potassium: 5.3 mEq/L (ref 3.7–5.3)
Sodium: 139 mEq/L (ref 137–147)
Total Protein: 7 g/dL (ref 6.0–8.3)

## 2013-10-11 LAB — URINE CULTURE
Colony Count: NO GROWTH
Culture: NO GROWTH

## 2013-10-11 LAB — PREPARE RBC (CROSSMATCH)

## 2013-10-11 LAB — IGG, IGA, IGM
IGG (IMMUNOGLOBIN G), SERUM: 2560 mg/dL — AB (ref 650–1600)
IgA: 84 mg/dL (ref 68–379)
IgM, Serum: 29 mg/dL — ABNORMAL LOW (ref 41–251)

## 2013-10-11 MED ORDER — FUROSEMIDE 10 MG/ML IJ SOLN
20.0000 mg | Freq: Once | INTRAMUSCULAR | Status: AC
  Administered 2013-10-11: 18:00:00 20 mg via INTRAVENOUS
  Filled 2013-10-11: qty 2

## 2013-10-11 NOTE — Progress Notes (Addendum)
Spoke with MD and IV team this evening, will keep IV from the 10/05/13. Pt. Is a hard stick, Dsg. Changed by the IV team. IV that was place from the 7th, ultrasound used. MD would like [place] to assess tomorrow. At this time, Pt. Is doing well with IV. I was also present with the MD when asked about healthcare information being shared with Christean Grief. Pt. Stated " Christean Grief is allowed to know my information".

## 2013-10-11 NOTE — Progress Notes (Signed)
Physical Therapy Treatment Patient Details Name: Gilbert Deleon MRN: [id number] DOB: 1935-05-28 Today's Date: 10/11/2013 Time: 4235-3614 PT Time Calculation (min): 14 min  PT Assessment / Plan / Recommendation  History of Present Illness 79 year old male with history of multiple myeloma not on any therapy since August 2013, prostate cancer treated with hormonal therapy only, presented to [facility] ED 09/30/2013 with progressively worsening shortness of breath for the past 2-3 months, mostly with exertion but occasionally present at rest. Pt also reported having developed productive cough, subjective fevers, chills, abdominal discomfort n RUQ area. His evaluation in ED and so far has included CT angio which ruled out PE but it did show possible liver mets. HIDA scan was negative for acute cholecystitis. US abdomen showed  several rounded hypoechoic rimmed masses which may represent metastatic disease.   PT Comments   Pt much more lethargic and unable to assist any with mobility today.   Follow Up Recommendations  SNF     Does the patient have the potential to tolerate intense rehabilitation     Barriers to Discharge        Equipment Recommendations  Rolling walker with 5" wheels    Recommendations for Other Services    Frequency Min 2X/week   Progress towards PT Goals Progress towards PT goals: Not progressing toward goals - comment (lethargy)  Plan Frequency needs to be updated    Precautions / Restrictions Precautions Precautions: Fall   Pertinent Vitals/Pain No signs of pain.    Mobility  Bed Mobility Supine to sit: Total assist;+2 for physical assistance Sit to supine: Total assist;+2 for physical assistance General bed mobility comments: Pt unable to provide any assistance with mobility.     Exercises     PT Diagnosis:    PT Problem List:   PT Treatment Interventions:     PT Goals (current goals can now be found in the care plan section)    Visit Information  Last PT  Received On: 10/11/13 Assistance Needed: +2 History of Present Illness: 79 year old male with history of multiple myeloma not on any therapy since August 2013, prostate cancer treated with hormonal therapy only, presented to [facility] ED 10/18/2013 with progressively worsening shortness of breath for the past 2-3 months, mostly with exertion but occasionally present at rest. Pt also reported having developed productive cough, subjective fevers, chills, abdominal discomfort n RUQ area. His evaluation in ED and so far has included CT angio which ruled out PE but it did show possible liver mets. HIDA scan was negative for acute cholecystitis. US abdomen showed  several rounded hypoechoic rimmed masses which may represent metastatic disease.    Subjective Data      Cognition  Cognition Arousal/Alertness: Lethargic Behavior During Therapy: Flat affect Overall Cognitive Status: Impaired/Different from baseline Area of Impairment: Following commands;Problem solving Following Commands: Follows one step commands inconsistently Problem Solving: Slow processing    Balance  Balance Overall balance assessment: Needs assistance Sitting-balance support: Bilateral upper extremity supported;Feet supported Sitting balance-Leahy Scale: Zero Dynamic Sitting - Comments: Pt sat EOB x 5 minutes with lean to the lt.  Pt's LUE with giving out in sitting with jerking motion. Postural control: Left lateral lean  End of Session PT - End of Session Activity Tolerance: Patient limited by lethargy Patient left: in bed;with call bell/phone within reach;with bed alarm set Nurse Communication: Mobility status;Other (comment) (change since last visit)   GP     [facility] 10/11/2013, 11:43 AM  [place]

## 2013-10-11 NOTE — Progress Notes (Signed)
The patient is not seen. I received a telephone call from the patient's wife today. She requests that I call the [facility] at [phone] and ask for the "transfer coordinator" to arrange pt transfer back to [place].  I discussed this with the social worker as well as the hospitalist who is currently taking care of the patient. In my assessment, the patient should be a candidate for hospice care only. I did not make a phone call to the [place] as I do not think it is appropriate to transfer the patient there. I will be happy to assist in any way I can in the future management of the patient.

## 2013-10-11 NOTE — Progress Notes (Signed)
[facility] PROGRESS NOTE  Gilbert Deleon CWC:[id number] DOB: 28-Mar-1935 DOA: 10/24/2013 PCP: No PCP Per Patient Brief Narrative:  Gilbert Deleon is a 79 y.o. male with Past medical history of Multiple myelomaNot on any therapy since August 2013, prostate cancer treated with hormonal therapy only. Presented with complaints of shortness of breath worse with exertion for 2-3 months. He also reported right upper quadrant pain>> denied any association with meals. He should also reported chronic constipation, and bloating. He stated that  in 2010 he was diagnosed with Prostate cancer and multiple myeloma.  for prostate cancer he has undergone only hormonal therapy without any chemotherapy or radiation or surgery.  for multiple myeloma he was placed on thalidomide, mycophenolate, Revlimid with prednisone and since August 2013 he's not taking any medication. All this treatment has been done at [place].  Assessment/Plan: #1 abdominal pain/mucinous Adenocarcinoma -Following admission and her surgery was consulted and they saw patient and further workup included a hiatus scan which came back normal and the stated no surgery indicated -Patient had had a CT angiogram of his chest which showed multiple hypervascular metastatic appearing lesions>> this or IR was consulted for biopsy  -Status post liver biopsy on 1/8 and results showing mucinous adeno and the features favor a primary pancreatobiliary adenocarcinoma.  -appreciate Dr. Calton Dach input, I contacted [facility] for transfer as per pt/family on 1/10 and pt accepted to Hospitalist service by Dr Gwenlyn Saran pending bed>> they emphasized thaht they have a long waiting list -above was discussed with pt and family and Dr Alvy Bimler and plan is to continue work up her pending bed and family is agreeable to this -CT of abd and pelvis shows 7.1 x 7.3 cm mass situated at the pancreatic tail and splenic hilum. Question primary pancreatic neoplasm vs Mets and a 5 x 7 x 11 cm filling  defect along the posterior distal sigmoid colon/rectum -await bone scan - I discussed above findings with Pt/brother on 1/11 -GI consulted for further recs and pt seen by Dr Ardis Hughs and states that the large sigmoid/rectal filling defect is likely a drop mets vs second primary and recommends no further GI worK -Pt denies Further pain at this time, follow. -Patient discussed with Dr. Alvy Bimler which she states given his performance status at this time she does not recommend transfer anywhere for further treatment as she would not offer any further treatment at this time -she recommends hospice>> I. discussed this with pt consult palate of care/hospice for goals -At this time patient states he would like both his wife and brother to be present for goals of care #2Fever/probable HAP - cxr with worsening of interstitial edema or interstitial infiltrates -UA negative -Blood cultures so far with no growth -Patient defervesced with with empiric antibiotics for pneumonia -Recommend to treat with vancomycin and cefepime/abx started on 1/12 for 7days. #3 acute respiratory distress-on admission now resolved Questionable etiology. Chest x-ray was negative for pneumonia. Influenza PCR was negative. Patient with clinical improvement. Blood cultures were no growth to date. -Repeat blood cultures so far with no growth #4 abnormal LFTs Likely secondary to probable metastatic liver disease. -s/p ultrasound-guided biopsy of liver lesions today 1/8,  results as above -A. Phos and transaminases trending up today but Bili about the same #5 anemia of chronic disease Hemoglobin 7.7>> we'll transfuse a unit of packed red blood cells as per heme recommendation -Follow #6 acute renal failure Likely secondary to prerenal azotemia . Renal function improvement.  - Creatinine normalized,  IV fluids resumed prior  to CT on 1/10 and continue Cozaar for BP control (resumed on 1/9) -see as discussed below -Creatinine remains  stable follow and recheck #7 hypertension Continue Lopressor 75 mg twice daily. Continue Hytrin.  -continue Cozaar, renal function remains #8hypercalacemia -Likely secondary to multiple myeloma -Discussed with oncology>>per Dr Alvy Bimler  IV pamidronate and steroid started on 1/12 -No significant change in calcium today, follow and recheck in a.m. -Continue dexamethasone #9prophylaxis SCDs for DVT prophylaxis. #10 acute encephalopathy-metabolic+/-toxic -Likely due to hypercalcemia and possible infection  -Improving on antibiotics and treatment as above -ammonia level within normal limits  Code Status: Full Family Communication: Updated brother by phone Disposition Plan: Pending palliative consultation   Consultants:  General surgery: Dr. Donne Hazel 10/02/2013  onc  GI   Procedures:  Chest x-ray 10/06/2013  Abdominal ultrasound 10/07/2013  CT angiogram of the chest 10/10/2013   HIDA Scan 10/02/2013  VQ scan 10/03/2013  Liver biopsy 1/8  CT abd and pelvis>>1/11  Diagnosis Liver, needle/core biopsy, Right lesion - MUCINOUS ADENOCARCINOMA. - SEE COMMENT. Microscopic Comment Given the radiologic findings, the features favor a primary pancreatobiliary adenocarcinoma. (JBK:ecj Antibiotics:  IV azithromycin 10/02/2013 >>> 10/02/2013  IV Rocephin 10/02/2013>>>> 10/02/2013  IV Zosyn 10/02/2013>>>> 10/04/2013  IV vancomycin 10/02/2013>>>> 10/04/2013, Vanc restarted on 1/12  Cefepime and started on 1/12  HPI/Subjective: Patient is more alert today and answers simple questions Objective: Filed Vitals:   10/11/13 1412  BP: 160/94  Pulse: 64  Temp: 97.3 F (36.3 C)  Resp: 20    Intake/Output Summary (Last 24 hours) at 10/11/13 1431 Last data filed at 10/11/13 1413  Gross per 24 hour  Intake    655 ml  Output    775 ml  Net   -120 ml   Filed Weights   10/02/13 0252 10/02/13 0600  Weight: 127.007 kg (280 lb) 127.325 kg (280 lb 11.2 oz)     Exam:   General:  More alert, appropriate  Cardiovascular: RRR  Respiratory: CTAB  Abdomen: Obese, soft, nontender, nondistended, positive bowel sounds.  Extremities: No clubbing cyanosis or edema.  Data Reviewed: Basic Metabolic Panel:  Recent Labs Lab 10/05/13 0512 10/06/13 0650 10/08/13 0403 10/09/13 0426 10/11/13 0605  NA 139 138 139 137 139  K 4.2 4.5 4.6 4.6 5.3  CL 106 106 107 105 107  CO2 _0 GLUCOSE 103* 99 95 123* 128*  BUN 25* 19 25* 29* 33*  CREATININE 1.24 1.06 1.20 1.30 1.23  CALCIUM 9.9 10.0 10.5 10.7* 10.9*   Liver Function Tests:  Recent Labs Lab 10/05/13 0512 10/06/13 0650 10/08/13 0403 10/09/13 0426 10/11/13 0605  AST 153* 142* 107* 139* 154*  ALT 109* 100* 82* 94* 101*  ALKPHOS 306* 303* 282* 350* 378*  BILITOT 1.8* 1.4* 1.4* 1.4* 1.4*  PROT 7.3 6.8 6.9 6.9 7.0  ALBUMIN 2.5* 2.4* 2.4* 2.4* 2.4*   No results found for this basename: LIPASE, AMYLASE,  in the last 168 hours  Recent Labs Lab 10/10/13 1318  AMMONIA 44   CBC:  Recent Labs Lab 10/05/13 0512 10/06/13 0650 10/09/13 0426 10/10/13 1809  WBC 6.1 5.6 6.4 6.0  NEUTROABS  --   --  5.0  --   HGB 8.1* 8.0* 7.7* 7.7*  HCT 25.1* 23.5* 23.9* 24.0*  MCV 90.9 90.7 91.6 92.3  PLT 116* 106* 114* 100*   Cardiac Enzymes: No results found for this basename: CKTOTAL, CKMB, CKMBINDEX, TROPONINI,  in the last 168 hours BNP (last 3 results) No results found  for this basename: PROBNP,  in the last 8760 hours CBG: No results found for this basename: GLUCAP,  in the last 168 hours  Recent Results (from the past 240 hour(s))  CULTURE, BLOOD (ROUTINE X 2)     Status: None   Collection Time    10/02/13  8:40 AM      Result Value Range Status   Specimen Description BLOOD LEFT ARM   Final   Special Requests BOTTLES DRAWN AEROBIC ONLY 10CC   Final   Culture  Setup Time     Final   Value: 10/02/2013 16:12     Performed at [facility]   Culture     Final    Value: NO GROWTH 5 DAYS     Performed at [facility]   Report Status 10/08/2013 FINAL   Final  CULTURE, BLOOD (ROUTINE X 2)     Status: None   Collection Time    10/02/13  8:45 AM      Result Value Range Status   Specimen Description BLOOD LEFT HAND   Final   Special Requests BOTTLES DRAWN AEROBIC ONLY 10CC   Final   Culture  Setup Time     Final   Value: 10/02/2013 16:13     Performed at [facility]   Culture     Final   Value: NO GROWTH 5 DAYS     Performed at [facility]   Report Status 10/08/2013 FINAL   Final  CULTURE, BLOOD (ROUTINE X 2)     Status: None   Collection Time    10/10/13 10:12 AM      Result Value Range Status   Specimen Description BLOOD LEFT ARM   Final   Special Requests BOTTLES DRAWN AEROBIC AND ANAEROBIC 2CC   Final   Culture  Setup Time     Final   Value: 10/10/2013 14:16     Performed at [facility]   Culture     Final   Value:        BLOOD CULTURE RECEIVED NO GROWTH TO DATE CULTURE WILL BE HELD FOR 5 DAYS BEFORE ISSUING A FINAL NEGATIVE REPORT     Performed at [facility]   Report Status PENDING   Incomplete  CULTURE, BLOOD (ROUTINE X 2)     Status: None   Collection Time    10/10/13 10:21 AM      Result Value Range Status   Specimen Description BLOOD LEFT HAND   Final   Special Requests     Final   Value: BOTTLES DRAWN AEROBIC AND ANAEROBIC AERO 10CC ANA [place]   Culture  Setup Time     Final   Value: 10/10/2013 14:16     Performed at [facility]   Culture     Final   Value:        BLOOD CULTURE RECEIVED NO GROWTH TO DATE CULTURE WILL BE HELD FOR 5 DAYS BEFORE ISSUING A FINAL NEGATIVE REPORT     Performed at [facility]   Report Status PENDING   Incomplete     Studies: Nm Bone Scan Whole Body  10/10/2013   CLINICAL DATA:  Liver metastases  EXAM: NUCLEAR MEDICINE WHOLE BODY BONE SCAN  TECHNIQUE: Whole body anterior and posterior images were obtained approximately 3 hours after  intravenous injection of radiopharmaceutical.  COMPARISON:  US BIOPSY dated 10/06/2013; NM HEPATOBILIARY INCLUDE GB dated 10/02/2013; CT ABD/PELVIS W CM dated 10/09/2013  RADIOPHARMACEUTICALS:  Twenty-five 0 Technetium-99 MDP  FINDINGS: Patient rotated leftward. No abnormal accumulation of radiotracer within the axillary or appendicular skeleton to suggest metastasis. Inferior sacrum is obscured by radiotracer in the bladder. Symmetric degenerate uptake noted proximal shoulders and sternoclavicular joints.  IMPRESSION: No scintigraphic evidence skeletal metastasis.   Electronically Signed   By: Suzy Bouchard M.D.   On: 10/10/2013 14:39   Dg Chest Port 1 View  10/10/2013   CLINICAL DATA:  Fever and hypertension  EXAM: PORTABLE CHEST - 1 VIEW  COMPARISON:  Chest x-ray of October 01, 2012  FINDINGS: The lungs remain hypoinflated. The interstitial markings are more prominent today than on the previous study. The coarse lung markings in the right infrahilar region are not well demonstrated due to the adjacent right hemidiaphragm. The cardiac silhouette remains enlarged. The central pulmonary vascularity is mildly prominent but cephalization is not demonstrated. The left hemidiaphragm is partially obscured today.  IMPRESSION: The findings suggest worsening of interstitial edema or interstitial infiltrates. There is partial obscuration of the left hemidiaphragm which may reflect atelectasis or adjacent pleural effusion. A followup PA and lateral chest x-ray with deep inspiration would be of value.   Electronically Signed   By: David  Martinique   On: 10/10/2013 10:06    Scheduled Meds: . sodium chloride   Intravenous Once  . allopurinol  100 mg Oral Daily  . antiseptic oral rinse  15 mL Mouth Rinse BID  . aspirin EC  325 mg Oral Daily  . dexamethasone  4 mg Intravenous Q12H  . feeding supplement (RESOURCE BREEZE)  1 Container Oral BID BM  . losartan  25 mg Oral Daily  . metoprolol  75 mg Oral BID  . senna-docusate   1 tablet Oral BID  . terazosin  20 mg Oral QHS   Continuous Infusions:    Principal Problem:   Abdominal pain, unspecified site Active Problems:   Multiple myeloma   Prostate cancer   Anemia   Renal failure   Liver mass   Splenic mass   Hypertension   Pancreatic cancer    Time spent: 35 minutes    Sheila Oats M.D. [facility] Pager [phone]. If 7PM-7AM, please contact night-coverage at [email or web address], password [facility] 10/11/2013, 2:31 PM  LOS: 10 days

## 2013-10-11 NOTE — Progress Notes (Signed)
Patient refused CPAP at this time, said he did not want to wear tonight.  He will let nurse know to call me if he changes his mind.

## 2013-10-12 DIAGNOSIS — Z515 Encounter for palliative care: Secondary | ICD-10-CM

## 2013-10-12 LAB — CBC
HCT: 28.6 % — ABNORMAL LOW (ref 39.0–52.0)
HEMOGLOBIN: 9.4 g/dL — AB (ref 13.0–17.0)
MCH: 29.7 pg (ref 26.0–34.0)
MCHC: 32.9 g/dL (ref 30.0–36.0)
MCV: 90.2 fL (ref 78.0–100.0)
PLATELETS: 120 10*3/uL — AB (ref 150–400)
RBC: 3.17 MIL/uL — ABNORMAL LOW (ref 4.22–5.81)
RDW: 18.3 % — ABNORMAL HIGH (ref 11.5–15.5)
WBC: 9.3 10*3/uL (ref 4.0–10.5)

## 2013-10-12 LAB — BASIC METABOLIC PANEL
BUN: 35 mg/dL — ABNORMAL HIGH (ref 6–23)
CO2: 22 mEq/L (ref 19–32)
Calcium: 11.5 mg/dL — ABNORMAL HIGH (ref 8.4–10.5)
Chloride: 107 mEq/L (ref 96–112)
Creatinine, Ser: 1.18 mg/dL (ref 0.50–1.35)
GFR calc non Af Amer: 58 mL/min — ABNORMAL LOW (ref >90)
GFR, EST AFRICAN AMERICAN: 67 mL/min — AB (ref >90)
Glucose, Bld: 151 mg/dL — ABNORMAL HIGH (ref 70–99)
POTASSIUM: 5.1 meq/L (ref 3.7–5.3)
SODIUM: 138 meq/L (ref 137–147)

## 2013-10-12 LAB — PSA: PSA: 1.68 ng/mL (ref <=4.00)

## 2013-10-12 MED ORDER — HYDRALAZINE HCL 20 MG/ML IJ SOLN
5.0000 mg | Freq: Once | INTRAMUSCULAR | Status: AC
  Administered 2013-10-12: 04:00:00 5 mg via INTRAVENOUS
  Filled 2013-10-12: qty 1

## 2013-10-12 NOTE — Progress Notes (Signed)
B/p still high. Contacting NP Baltazar Najjar for any further orders.

## 2013-10-12 NOTE — Progress Notes (Signed)
[facility] PROGRESS NOTE  Gilbert Deleon GEX:[id number] DOB: 1935-07-23 DOA: 10/02/2013  [facility]  Brief Narrative: Gilbert Deleon is a 79 y.o. male with Past medical history of Multiple myeloma not on any therapy since August 2013, prostate cancer treated with hormonal therapy only. Presented with complaints of shortness of breath worse with exertion for 2-3 months. He also reported right upper quadrant pain>> denied any association with meals. He should also reported chronic constipation, and bloating. He stated that  in 2010 he was diagnosed with Prostate cancer and multiple myeloma. For prostate cancer he has undergone only hormonal therapy without any chemotherapy or radiation or surgery. For multiple myeloma he was placed on thalidomide, mycophenolate, Revlimid with prednisone and since August 2013 he's not taking any medication. All this treatment has been done at [place].  Assessment/Plan: #1 Abdominal pain/mucinous Adenocarcinoma/Pancreatic Tumor -Following admission, general surgery was consulted and they saw patient and further workup included a HIDA scan which came back normal and hence no surgery indicated. -Patient had had a CT angiogram of his chest which showed multiple hypervascular metastatic appearing lesions>> this or IR was consulted for biopsy  -Status post liver biopsy on 1/8 and results showing mucinous adeno and the features favor a primary pancreatobiliary adenocarcinoma.  - Seen by Oncology Dr. Alvy Bimler. Family requested transfer to [facility] but no beds were available and patient was wait-listed.  - Above was discussed with pt and family and Dr Alvy Bimler and plan was to continue work up here pending bed and family was agreeable to this -CT of abd and pelvis shows 7.1 x 7.3 cm mass situated at the pancreatic tail and splenic hilum. Question primary pancreatic neoplasm vs Mets and a 5 x 7 x 11 cm filling defect along the posterior distal sigmoid colon/rectum. Bone scan shows no skeletal  metastases. - GI consulted for further recs and pt seen by Dr Ardis Hughs and states that the large sigmoid/rectal filling defect is likely a drop mets vs second primary and recommends no further GI work up at this time.  -Patient discussed with Dr. Alvy Bimler and she states given his performance status at this time she does not recommend transfer anywhere for further treatment. She would not offer any further treatment at this time. She recommends hospice. Await PMT consult.  -At this time patient states he would like both his wife and brother to be present for goals of care. Wife is expected to come in from [place] today.  #2 Fever/probable HAP - cxr with worsening of interstitial edema or interstitial infiltrates. Possibly related to cancer. -UA negative -Blood cultures so far with no growth -Patient defervesced with with empiric antibiotics for pneumonia -Recommend to treat with vancomycin and cefepime/abx started on 1/12 for 7days.  #3 acute respiratory distress-on admission now resolved Questionable etiology. Chest x-ray was negative for pneumonia. Influenza PCR was negative. Patient with clinical improvement. Blood cultures were no growth to date. -Repeat blood cultures so far with no growth  #4 Abnormal LFTs Likely secondary to probable metastatic liver disease. -s/p ultrasound-guided biopsy of liver lesions today 1/8,  results as above  #5 Anemia of chronic disease Hemoglobin 7.7 1/12 and patient was transfused.   #6 Acute renal failure Likely secondary to prerenal azotemia . Renal function improved.  - Creatinine normalized,  IV fluids resumed prior to CT on 1/10 and continue Cozaar for BP control (resumed on 1/9) -see as discussed below -Creatinine remains stable follow and recheck  #7 hypertension Continue Lopressor 75 mg twice daily. Continue  Hytrin.  -continue Cozaar. Hydralazine PRN.  #8 Hypercalcemia -Likely secondary to malignancy -Given IV pamidronate and steroid on 1/12 -No  significant change in calcium. Continue dexamethasone -May need to repeat Bisphosphonates  #9 Prophylaxis SCDs for DVT prophylaxis.  #10 Acute encephalopathy-metabolic+/-toxic -Likely due to hypercalcemia and possible infection  -Improving on antibiotics and treatment as above -ammonia level within normal limits  #11 Complex Social Situation Apparently his wife lives in [place] and is expected to arrive today. Unclear what their status is at this time. His brother is in town. Brother requested transfer to [facility] and wife wanted him at [place] in [place]. At this time all this is moot as patient has poor prognosis. CSW assisting with clarifying social situation. PMT to meet with patient and his brother and wife.  Code Status: Full Code for now. To be addressed by PMT at family meeting. Family Communication: No family at bedside Disposition Plan: Pending palliative consultation. Likely a candidate for hospice.   Consultants:  General surgery: Dr. Donne Hazel 10/02/2013  onc  GI  PMT   Procedures:  Chest x-ray 10/04/2013  Abdominal ultrasound 09/30/2013  CT angiogram of the chest 10/13/2013   HIDA Scan 10/02/2013  VQ scan 10/03/2013  Liver biopsy 1/8  CT abd and pelvis>>1/11  Diagnosis Liver, needle/core biopsy, Right lesion - MUCINOUS ADENOCARCINOMA. - SEE COMMENT. Microscopic Comment Given the radiologic findings, the features favor a primary pancreatobiliary adenocarcinoma. (JBK:ecj  Antibiotics:  IV azithromycin 10/02/2013 >>> 10/02/2013  IV Rocephin 10/02/2013>>>> 10/02/2013  IV Zosyn 10/02/2013>>>> 10/04/2013  IV vancomycin 10/02/2013>>>> 10/04/2013, Vanc restarted on 1/12  Cefepime and started on 1/12  HPI/Subjective: Patient is alert but confused. Denies any pain.   Objective: Filed Vitals:   10/12/13 1014  BP: 150/110  Pulse: 82  Temp:   Resp:     Intake/Output Summary (Last 24 hours) at 10/12/13 1231 Last data filed at 10/12/13 0900  Gross per 24 hour   Intake    720 ml  Output   1025 ml  Net   -305 ml   Filed Weights   10/02/13 0252 10/02/13 0600  Weight: 127.007 kg (280 lb) 127.325 kg (280 lb 11.2 oz)    Exam:   General:  More alert, appropriate  Cardiovascular: RRR  Respiratory: CTAB  Abdomen: Obese, soft, slightly tender in RUQ, nondistended, positive bowel sounds.  Extremities: No clubbing cyanosis or edema.  Data Reviewed: Basic Metabolic Panel:  Recent Labs Lab 10/06/13 0650 10/08/13 0403 10/09/13 0426 10/11/13 0605 10/12/13 0630  NA 138 139 137 139 138  K 4.5 4.6 4.6 5.3 5.1  CL 106 107 105 107 107  CO2 19 20 22 21 22   GLUCOSE 99 95 123* 128* 151*  BUN 19 25* 29* 33* 35*  CREATININE 1.06 1.20 1.30 1.23 1.18  CALCIUM 10.0 10.5 10.7* 10.9* 11.5*   Liver Function Tests:  Recent Labs Lab 10/06/13 0650 10/08/13 0403 10/09/13 0426 10/11/13 0605  AST 142* 107* 139* 154*  ALT 100* 82* 94* 101*  ALKPHOS 303* 282* 350* 378*  BILITOT 1.4* 1.4* 1.4* 1.4*  PROT 6.8 6.9 6.9 7.0  ALBUMIN 2.4* 2.4* 2.4* 2.4*    Recent Labs Lab 10/10/13 1318  AMMONIA 44   CBC:  Recent Labs Lab 10/06/13 0650 10/09/13 0426 10/10/13 1809 10/12/13 0552  WBC 5.6 6.4 6.0 9.3  NEUTROABS  --  5.0  --   --   HGB 8.0* 7.7* 7.7* 9.4*  HCT 23.5* 23.9* 24.0* 28.6*  MCV 90.7 91.6 92.3 90.2  PLT 106* 114* 100* 120*    Recent Results (from the past 240 hour(s))  CULTURE, BLOOD (ROUTINE X 2)     Status: None   Collection Time    10/10/13 10:12 AM      Result Value Range Status   Specimen Description BLOOD LEFT ARM   Final   Special Requests BOTTLES DRAWN AEROBIC AND ANAEROBIC 2CC   Final   Culture  Setup Time     Final   Value: 10/10/2013 14:16     Performed at [facility]   Culture     Final   Value:        BLOOD CULTURE RECEIVED NO GROWTH TO DATE CULTURE WILL BE HELD FOR 5 DAYS BEFORE ISSUING A FINAL NEGATIVE REPORT     Performed at [facility]   Report Status PENDING   Incomplete  CULTURE,  BLOOD (ROUTINE X 2)     Status: None   Collection Time    10/10/13 10:21 AM      Result Value Range Status   Specimen Description BLOOD LEFT HAND   Final   Special Requests     Final   Value: BOTTLES DRAWN AEROBIC AND ANAEROBIC AERO 10CC ANA [place]   Culture  Setup Time     Final   Value: 10/10/2013 14:16     Performed at [facility]   Culture     Final   Value:        BLOOD CULTURE RECEIVED NO GROWTH TO DATE CULTURE WILL BE HELD FOR 5 DAYS BEFORE ISSUING A FINAL NEGATIVE REPORT     Performed at [facility]   Report Status PENDING   Incomplete  URINE CULTURE     Status: None   Collection Time    10/10/13  2:03 PM      Result Value Range Status   Specimen Description URINE, CLEAN CATCH   Final   Special Requests NONE   Final   Culture  Setup Time     Final   Value: 10/10/2013 23:41     Performed at [place]     Final   Value: NO GROWTH     Performed at [facility]   Culture     Final   Value: NO GROWTH     Performed at [facility]   Report Status 10/11/2013 FINAL   Final     Studies: Nm Bone Scan Whole Body  10/10/2013   CLINICAL DATA:  Liver metastases  EXAM: NUCLEAR MEDICINE WHOLE BODY BONE SCAN  TECHNIQUE: Whole body anterior and posterior images were obtained approximately 3 hours after intravenous injection of radiopharmaceutical.  COMPARISON:  US BIOPSY dated 10/06/2013; NM HEPATOBILIARY INCLUDE GB dated 10/02/2013; CT ABD/PELVIS W CM dated 10/09/2013  RADIOPHARMACEUTICALS:  Twenty-five 0 Technetium-99 MDP  FINDINGS: Patient rotated leftward. No abnormal accumulation of radiotracer within the axillary or appendicular skeleton to suggest metastasis. Inferior sacrum is obscured by radiotracer in the bladder. Symmetric degenerate uptake noted proximal shoulders and sternoclavicular joints.  IMPRESSION: No scintigraphic evidence skeletal metastasis.   Electronically Signed   By: Suzy Bouchard M.D.   On: 10/10/2013 14:39     Scheduled Meds: . allopurinol  100 mg Oral Daily  . antiseptic oral rinse  15 mL Mouth Rinse BID  . aspirin EC  325 mg Oral Daily  . dexamethasone  4 mg Intravenous Q12H  . feeding supplement (RESOURCE BREEZE)  1 Container Oral  BID BM  . losartan  25 mg Oral Daily  . metoprolol  75 mg Oral BID  . senna-docusate  1 tablet Oral BID  . terazosin  20 mg Oral QHS   Continuous Infusions:    Principal Problem:   Abdominal pain, unspecified site Active Problems:   Multiple myeloma   Prostate cancer   Anemia   Renal failure   Liver mass   Splenic mass   Hypertension   Pancreatic cancer     Maddisyn Hegwood M.D. [facility] Pager [phone].   If 7PM-7AM, please contact night-coverage at [email or web address], password [facility] 10/12/2013, 12:31 PM  LOS: 11 days

## 2013-10-12 NOTE — Progress Notes (Signed)
5mg  of hydralazine was ordered.

## 2013-10-12 NOTE — Progress Notes (Signed)
Spoke to NP Baltazar Najjar about patient's b/p prior to starting the blood. RN told to give PRN hydralazine and proceed. RN will continue to monitor b/p with hourly checks until blood is completed.

## 2013-10-12 NOTE — Progress Notes (Signed)
Spoke with patient's brother by phone re: solidifying GOC-he understands serious nature of patient's illness-he agrees a hospice facility is appropriate. He is asking about [facility] in [place], on [address]. Patient has a prognosis of <2 weeks, I anticipate he will become more unresponsive-I have not received a call from his wife-will continue to arrange for a meeting with everyone. Now DNR-I discussed this with patient earlier as well as Hospice.   Lane Hacker, DO Palliative Medicine'

## 2013-10-12 NOTE — Progress Notes (Signed)
Placed patient on CPAP 7cm H20.

## 2013-10-12 NOTE — Progress Notes (Signed)
Patient [id number] Gilbert Deleon      DOB: [birth year]      HQP:[id number]  Consult for Goals of care received.  Will get scheduled with wife , brother and patient as soon as possible.   Gilbert Strohm L. Lovena Le, MD MBA The Palliative Medicine Team at [facility] Phone: [phone] Pager: [phone]

## 2013-10-12 NOTE — Progress Notes (Signed)
Notified Dr. Bonnielee Haff of BP of 150/110. MD aware. Will continue to monitor.

## 2013-10-12 NOTE — Progress Notes (Signed)
Blood is completed. 1 Unit given. Patient tolerated well. No fevers, chills, n/v, or reactions. Patient's b/p continues to be high but hydralazine has been ordered for a one time dose after the blood. Medication given. Will monitor b/p.

## 2013-10-12 NOTE — Progress Notes (Signed)
Bed placement at [facility] called RN to verify that patient was still here at [facility] and awaiting a bed. RN verified that patient does need a bed as soon as one becomes available.

## 2013-10-12 NOTE — Progress Notes (Addendum)
Palliative Medicine Team Consult Note  Reason for Consultation: Goals of Care, Hospice Referral  79 yo with wide spread metastatic cancer-pancreatic and also multiple myeloma. Prognosis is very poor. PMT consulted for goals of care discussion.  Problem List: 1. Multiple Myeloma 2. Pancreatic Cancer 3. Prostate Cancer 4. Liver metastasis, severe 5. Altered mental status 6. Weakness 7. Renal Failure 8. Possible CAP 9. Hypercalcemia of malignancy 10. Peripheral edema, severe 11. Protein Calorie Malnutrition  I have reviewed his chart in detail and evaluated Mr. Prohaska. I have left a message with his wife Ivery Nanney. Mr. Willhoite is very lethargic this AM. He is very slow to respond to my questions and is having significant confusion. He could not tell me his wife's phone number but I was able to obtain that through a records review only. His brother is listed as primary contact in [place]. There are no formal HCPOA documents-I am not sure patient has full capacity, he could not tell me how he got to [facility] - he told me he lived in [place] and then later told me [place]. He tried to explain the situation with his wife but it was difficult to [facility] apparently moved to this area in 2002, but his wife didn't like it here and moved back to [place], unclear how much they have been involved with each other. The patient knew he was very sick before he came to hospital. He endorsed slow mentation and difficulty speaking and getting his thoughts out into words.  Mr. Earnhart has agreed to a family meeting with his wife and brother. I have tried to contact both-awaiting return calls-hopefully can achieve this soon, as he has significant hypercalcemia, patient very lethargic, he agrees to DNR, understands that he has incurable disease and a short life expectancy.  Brother - Wynetta Emery [phone]  Wife - Francesca Jewett [phone] Barbaraann Faster [phone]  Will  follow up this afternoon.  50 minutes. Greater than 50%  of this time was spent counseling and coordinating care related to the above assessment and plan.  Lane Hacker, DO Palliative Medicine

## 2013-10-13 DIAGNOSIS — E46 Unspecified protein-calorie malnutrition: Secondary | ICD-10-CM | POA: Diagnosis present

## 2013-10-13 LAB — COMPREHENSIVE METABOLIC PANEL
ALK PHOS: 387 U/L — AB (ref 39–117)
ALT: 128 U/L — AB (ref 0–53)
AST: 166 U/L — ABNORMAL HIGH (ref 0–37)
Albumin: 2.3 g/dL — ABNORMAL LOW (ref 3.5–5.2)
BUN: 42 mg/dL — ABNORMAL HIGH (ref 6–23)
CO2: 20 meq/L (ref 19–32)
Calcium: 11.3 mg/dL — ABNORMAL HIGH (ref 8.4–10.5)
Chloride: 109 mEq/L (ref 96–112)
Creatinine, Ser: 1.35 mg/dL (ref 0.50–1.35)
GFR, EST AFRICAN AMERICAN: 57 mL/min — AB (ref >90)
GFR, EST NON AFRICAN AMERICAN: 49 mL/min — AB (ref >90)
GLUCOSE: 132 mg/dL — AB (ref 70–99)
POTASSIUM: 5.1 meq/L (ref 3.7–5.3)
SODIUM: 140 meq/L (ref 137–147)
Total Bilirubin: 1.4 mg/dL — ABNORMAL HIGH (ref 0.3–1.2)
Total Protein: 7 g/dL (ref 6.0–8.3)

## 2013-10-13 LAB — CBC
HCT: 28.2 % — ABNORMAL LOW (ref 39.0–52.0)
HEMOGLOBIN: 9.1 g/dL — AB (ref 13.0–17.0)
MCH: 29.6 pg (ref 26.0–34.0)
MCHC: 32.3 g/dL (ref 30.0–36.0)
MCV: 91.9 fL (ref 78.0–100.0)
Platelets: 111 10*3/uL — ABNORMAL LOW (ref 150–400)
RBC: 3.07 MIL/uL — ABNORMAL LOW (ref 4.22–5.81)
RDW: 19.2 % — ABNORMAL HIGH (ref 11.5–15.5)
WBC: 8.3 10*3/uL (ref 4.0–10.5)

## 2013-10-13 LAB — TYPE AND SCREEN
ABO/RH(D): B POS
Antibody Screen: NEGATIVE
UNIT DIVISION: 0

## 2013-10-13 MED ORDER — MORPHINE SULFATE (CONCENTRATE) 10 MG /0.5 ML PO SOLN
10.0000 mg | ORAL | Status: DC | PRN
  Administered 2013-10-16 – 2013-10-18 (×4): 10 mg via ORAL
  Filled 2013-10-13 (×4): qty 0.5

## 2013-10-13 NOTE — Progress Notes (Signed)
I met with patient and his wife Takoda Janowiak. She was at bedside today. She understands the serious nature of his illness, she is agreeable to [place] care. He has declined further today, much less responsive, I do not think he could survive a move out of state back to [place] unfortunately. Celestine does not have a preference for facility-she is ok with [facility] area-"what ever is best for him". Proceed with Hospice Facility search.  Lane Hacker, DO Palliative Medicine

## 2013-10-13 NOTE — Clinical Social Work Note (Signed)
CSW spoke with Gilbert Deleon over phone. Brother states that he would like for patient to go to [facility] in [place] explained that if residential hospice is decided on that CSW will try to get a bed at the family's preferred facility but this cannot be guaranteed. CSW informed brother that patient's information will be faxed out and we will need to proceed with first available option. Brother is agreeable to this.   Brother mentioned that patient has children...CSW explained that children would need to be contacted as they are next in line to make decisions for patient. Brother provided numbers to patient's sons:  Keavon Sensing.: [phone] - CSW has left message for son, waiting callback Younis Mathey: [phone] - unable to get through, constant busy signal, multiple attempts have been made.  Per MD, patient verbalized to MD on 10/08/13 that he would like his brother to make decisions. It should be noted that brother is the only family member actively involved in patient's care at this time. Several attempts have been made to reach "estranged" wife for palliative meeting. She said she would be here on 10/12/13 but she never showed. Brother states that he has discussed everything with patient's children and they are in agreement with patient going to residential. [place] would like to confirm this with children, but we will need to move forward with brother's decision if we do not receive a response.   Liz Beach, [place], [place], [phone]

## 2013-10-13 NOTE — Progress Notes (Signed)
I spoke with patient's brother this AM. He desires hospice facility placement in the [facility] area. He also states that patient has three children who will arrive on Saturday-he specifically requests that his brother be placed near him and or near family for his EOL care. Patient is not from the [place] area and has no contacts or family here-apparently his wife told EMS to bring him to [facility] from [place] where he lived. Patient is originally from [place], [place]. His children are in [place], his brother is an ophthalmologist in [place]. Gilbert Deleon has resources-if there is not a bed available in [place] they may want to have him transported to [place] to be near his children. Hospice Facility bed search in [place] area only please-this is for EOL care and family preference and closeness is important.  Gilbert Hacker, DO Palliative Medicine

## 2013-10-13 NOTE — Clinical Social Work Note (Signed)
Patient's son contact CSW and spoke at length about what he and his siblings would like for patient. Gilbert Deleon. States that Gilbert Deleon (patient's brother) has done very well with updating the children on their father's condition and son expressed good insight into the situation. The patient's son states that the siblings have discussed it and believe that moving their father to a residential hospice facility is the best option for the patient. They really want him to be close to Gilbert Deleon, so CSW will try to obtain bed in the [place] area (referrals have been made). Patient's son states that they have tried to contact the patient's wife, but he says that she has noted returned their calls. Son has a good understanding of the situation at hand, but states he would still like to discuss it with PMT. CSW will notified PMT MD.   Contact information for Gilbert Deleon. : [phone] or [address], [place], [place], [phone]

## 2013-10-13 NOTE — Progress Notes (Signed)
Spoke with patient about wearing his CPAP. Patient stated he would call when he was ready to wear it. RT will continue to monitor.

## 2013-10-13 NOTE — Progress Notes (Signed)
[facility] PROGRESS NOTE  [name] EBR:[id number] DOB: Feb 01, 1935 DOA: 10/17/2013  [facility]  Brief Narrative: Gilbert Deleon is a 79 y.o. male with Past medical history of Multiple myeloma not on any therapy since August 2013, prostate cancer treated with hormonal therapy only. Presented with complaints of shortness of breath worse with exertion for 2-3 months. He also reported right upper quadrant pain>> denied any association with meals. He should also reported chronic constipation, and bloating. He stated that  in 2010 he was diagnosed with Prostate cancer and multiple myeloma. For prostate cancer he has undergone only hormonal therapy without any chemotherapy or radiation or surgery. For multiple myeloma he was placed on thalidomide, mycophenolate, Revlimid with prednisone and since August 2013 he's not taking any medication. All this treatment has been done at [place].  Assessment/Plan: #1 Mucinous Adenocarcinoma/Pancreatic Tumor -Following admission, general surgery was consulted for abnormal appearing gall bladder suggestive of cholecystitis. They saw patient and further workup included a HIDA scan which came back normal and hence no surgery indicated. -Patient had had a CT angiogram of his chest which showed multiple hypervascular metastatic appearing lesions>> this or IR was consulted for biopsy  -Status post liver biopsy on 1/8 and results showing mucinous adeno and the features favor a primary pancreatobiliary adenocarcinoma.  - Seen by Oncology Dr. Alvy Bimler. Family requested transfer to [facility] but no beds were available and patient was wait-listed.  - Above was discussed with pt and family and Dr Alvy Bimler and plan was to continue work up here pending bed and family was agreeable to this -CT of abd and pelvis shows 7.1 x 7.3 cm mass situated at the pancreatic tail and splenic hilum. Question primary pancreatic neoplasm vs Mets and a 5 x 7 x 11 cm filling defect along the posterior distal  sigmoid colon/rectum. Bone scan shows no skeletal metastases. - GI consulted for further recs and pt seen by Dr Ardis Hughs and states that the large sigmoid/rectal filling defect is likely a drop mets vs second primary and recommends no further GI work up at this time.  -Patient discussed with Dr. Alvy Bimler and she states given his performance status at this time she does not recommend transfer anywhere for further treatment. She would not offer any further treatment at this time. She recommends hospice. Patient seen by PMT. They have discussed with patient's brother who is the only one available and based on patient's known wishes. He is now DNR and hospice eligible. CSW assisting with placement. -Wife was supposed to come in on 1/14 but she hasn't showed up yet.  #2 Fever/probable HAP - cxr with worsening of interstitial edema or interstitial infiltrates. Possibly related to cancer. -UA negative -Blood cultures so far with no growth -Patient defervesced with with empiric antibiotics for pneumonia -Continue vancomycin and cefepime for now. Abx started on 1/12.  #3 acute respiratory distress - Resolved  #4 Abnormal LFTs Likely secondary to probable metastatic liver lesions. -s/p ultrasound-guided biopsy of liver lesions 1/8,  results as noted above  #5 Anemia of chronic disease Hemoglobin 7.7 1/12 and patient was transfused.   #6 Acute renal failure Likely secondary to prerenal azotemia. Renal function improved.  - Creatinine normalized,  IV fluids resumed prior to CT on 1/10 and continue Cozaar for BP control (resumed on 1/9)  -Creatinine remains stable  #7 hypertension Continue Lopressor 75 mg twice daily. Continue Hytrin.  -continue Cozaar. Hydralazine PRN.  #8 Hypercalcemia -Likely secondary to malignancy -Given IV pamidronate and steroid on 1/12 -No  significant change in calcium. Continue dexamethasone  #9 Prophylaxis SCDs for DVT prophylaxis.  #10 Acute  encephalopathy-metabolic+/-toxic -Likely due to hypercalcemia and possible infection  -ammonia level within normal limits  #11 Complex Social Situation Apparently his wife lives in [place] and is expected to arrive today. Unclear what their status is at this time. His brother is in town. Brother requested transfer [facility] and wife wanted him at [place] in [place]. At this time all this is moot as patient has poor prognosis. CSW assisting with clarifying social situation. PMT met with patient and his brother. Wife did not show up. He is now DNR  Code Status: DNR. Family Communication: No family at bedside Disposition Plan: To Hospice when bed is available. PMT to address medications. Will likely stop most medications.   Consultants:  General surgery  Onc  [facility]  PMT   Procedures:  Chest x-ray 10/21/2013  Abdominal ultrasound 10/21/2013  CT angiogram of the chest 10/12/2013   HIDA Scan 10/02/2013  VQ scan 10/03/2013  Liver biopsy 1/8  CT abd and pelvis>>1/11  Diagnosis Liver, needle/core biopsy, Right lesion - MUCINOUS ADENOCARCINOMA. - SEE COMMENT. Microscopic Comment Given the radiologic findings, the features favor a primary pancreatobiliary adenocarcinoma. (JBK:ecj  Antibiotics:  IV azithromycin 10/02/2013 >>> 10/02/2013  IV Rocephin 10/02/2013>>>> 10/02/2013  IV Zosyn 10/02/2013>>>> 10/04/2013  IV vancomycin 10/02/2013>>>> 10/04/2013, Vanc restarted on 1/12  Cefepime started on 1/12  HPI/Subjective: Patient is sleepy. Arousable but goes back to sleep. Denies any pain. Confused.   Objective: Filed Vitals:   10/13/13 1017  BP: 134/79  Pulse: 81  Temp:   Resp:     Intake/Output Summary (Last 24 hours) at 10/13/13 1118 Last data filed at 10/13/13 0509  Gross per 24 hour  Intake    100 ml  Output    975 ml  Net   -875 ml   Filed Weights   10/02/13 0252 10/02/13 0600  Weight: 127.007 kg (280 lb) 127.325 kg (280 lb 11.2 oz)    Exam:   General:  More  alert, appropriate  Cardiovascular: RRR  Respiratory: CTAB  Abdomen: Obese, soft, slightly tender in RUQ, nondistended, positive bowel sounds.  Extremities: No clubbing cyanosis or edema.  Data Reviewed: Basic Metabolic Panel:  Recent Labs Lab 10/08/13 0403 10/09/13 0426 10/11/13 0605 10/12/13 0630 10/13/13 0421  NA 139 137 139 138 140  K 4.6 4.6 5.3 5.1 5.1  CL 107 105 107 107 109  CO2 _0 GLUCOSE 95 123* 128* 151* 132*  BUN 25* 29* 33* 35* 42*  CREATININE 1.20 1.30 1.23 1.18 1.35  CALCIUM 10.5 10.7* 10.9* 11.5* 11.3*   Liver Function Tests:  Recent Labs Lab 10/08/13 0403 10/09/13 0426 10/11/13 0605 10/13/13 0421  AST 107* 139* 154* 166*  ALT 82* 94* 101* 128*  ALKPHOS 282* 350* 378* 387*  BILITOT 1.4* 1.4* 1.4* 1.4*  PROT 6.9 6.9 7.0 7.0  ALBUMIN 2.4* 2.4* 2.4* 2.3*    Recent Labs Lab 10/10/13 1318  AMMONIA 44   CBC:  Recent Labs Lab 10/09/13 0426 10/10/13 1809 10/12/13 0552 10/13/13 0421  WBC 6.4 6.0 9.3 8.3  NEUTROABS 5.0  --   --   --   HGB 7.7* 7.7* 9.4* 9.1*  HCT 23.9* 24.0* 28.6* 28.2*  MCV 91.6 92.3 90.2 91.9  PLT 114* 100* 120* 111*    Recent Results (from the past 240 hour(s))  CULTURE, BLOOD (ROUTINE X 2)     Status: None  Collection Time    10/10/13 10:12 AM      Result Value Range Status   Specimen Description BLOOD LEFT ARM   Final   Special Requests BOTTLES DRAWN AEROBIC AND ANAEROBIC 2CC   Final   Culture  Setup Time     Final   Value: 10/10/2013 14:16     Performed at [facility]   Culture     Final   Value:        BLOOD CULTURE RECEIVED NO GROWTH TO DATE CULTURE WILL BE HELD FOR 5 DAYS BEFORE ISSUING A FINAL NEGATIVE REPORT     Performed at [facility]   Report Status PENDING   Incomplete  CULTURE, BLOOD (ROUTINE X 2)     Status: None   Collection Time    10/10/13 10:21 AM      Result Value Range Status   Specimen Description BLOOD LEFT HAND   Final   Special Requests     Final    Value: BOTTLES DRAWN AEROBIC AND ANAEROBIC AERO 10CC ANA Magnet   Culture  Setup Time     Final   Value: 10/10/2013 14:16     Performed at [facility]   Culture     Final   Value:        BLOOD CULTURE RECEIVED NO GROWTH TO DATE CULTURE WILL BE HELD FOR 5 DAYS BEFORE ISSUING A FINAL NEGATIVE REPORT     Performed at [facility]   Report Status PENDING   Incomplete  URINE CULTURE     Status: None   Collection Time    10/10/13  2:03 PM      Result Value Range Status   Specimen Description URINE, CLEAN CATCH   Final   Special Requests NONE   Final   Culture  Setup Time     Final   Value: 10/10/2013 23:41     Performed at [facility]     Final   Value: NO GROWTH     Performed at [facility]   Culture     Final   Value: NO GROWTH     Performed at [facility]   Report Status 10/11/2013 FINAL   Final     Studies: No results found.  Scheduled Meds: . allopurinol  100 mg Oral Daily  . antiseptic oral rinse  15 mL Mouth Rinse BID  . aspirin EC  325 mg Oral Daily  . dexamethasone  4 mg Intravenous Q12H  . feeding supplement (RESOURCE BREEZE)  1 Container Oral BID BM  . losartan  25 mg Oral Daily  . metoprolol  75 mg Oral BID  . senna-docusate  1 tablet Oral BID  . terazosin  20 mg Oral QHS   Continuous Infusions:    Principal Problem:   Abdominal pain, unspecified site Active Problems:   Multiple myeloma   Prostate cancer   Anemia   Renal failure   Liver mass   Splenic mass   Hypertension   Pancreatic cancer   Hypercalcemia   Unspecified protein-calorie malnutrition     Sri Clegg M.D. [facility] Pager [phone].   If 7PM-7AM, please contact night-coverage at [email or web address], password [facility] 10/13/2013, 11:18 AM  LOS: 12 days

## 2013-10-14 MED ORDER — MORPHINE SULFATE (CONCENTRATE) 10 MG /0.5 ML PO SOLN: 10.0000 mg | ORAL | Status: AC | PRN

## 2013-10-14 NOTE — Progress Notes (Signed)
[facility] PROGRESS NOTE  Elier Zellars SAY:[id number] DOB: 04/07/1935 DOA: 10/17/2013  [facility]  Brief Narrative: Gilbert Deleon is a 79 y.o. male with Past medical history of Multiple myeloma not on any therapy since August 2013, prostate cancer treated with hormonal therapy only. Presented with complaints of shortness of breath worse with exertion for 2-3 months. He also reported right upper quadrant pain>> denied any association with meals. He should also reported chronic constipation, and bloating. He stated that  in 2010 he was diagnosed with Prostate cancer and multiple myeloma. For prostate cancer he has undergone only hormonal therapy without any chemotherapy or radiation or surgery. For multiple myeloma he was placed on thalidomide, mycophenolate, Revlimid with prednisone and since August 2013 he's not taking any medication. All this treatment has been done at [place].  Assessment/Plan: #1 Mucinous Adenocarcinoma/Pancreatic Tumor -Following admission, general surgery was consulted for abnormal appearing gall bladder suggestive of cholecystitis. They saw patient and further workup included a HIDA scan which came back normal and hence no surgery indicated. -Patient had had a CT angiogram of his chest which showed multiple hypervascular metastatic appearing lesions. IR was consulted for biopsy  -Status post liver biopsy on 1/8 and results showing mucinous adeno and the features favor a primary pancreatobiliary adenocarcinoma.  - Seen by Oncology Dr. Alvy Bimler. Family requested transfer to [facility] but no beds were available and patient was wait-listed.  - Above was discussed with pt and family and Dr Alvy Bimler and plan was to continue work up here pending bed and family was agreeable to this -CT of abd and pelvis shows 7.1 x 7.3 cm mass situated at the pancreatic tail and splenic hilum. Question primary pancreatic neoplasm vs Mets and a 5 x 7 x 11 cm filling defect along the posterior distal sigmoid  colon/rectum. Bone scan shows no skeletal metastases. - GI consulted for further recs and pt seen by Dr Ardis Hughs and states that the large sigmoid/rectal filling defect is likely a drop mets vs second primary and recommends no further GI work up at this time.  -Patient discussed with Dr. Alvy Bimler and she states given his performance status at this time she does not recommend transfer anywhere for further treatment. She would not offer any further treatment at this time. She recommends hospice. Patient seen by PMT. They have discussed with patient's brother who is the only one available and based on patient's known wishes. He is now DNR and hospice eligible. CSW assisting with placement. -Wife did finally arrive and now agrees with above plan.   #2 Fever/probable HAP - cxr with worsening of interstitial edema or interstitial infiltrates. Possibly related to cancer. -UA negative -Blood cultures so far with no growth -Patient defervesced with with empiric antibiotics for pneumonia -Continue vancomycin and cefepime for now. Abx started on 1/12.  #3 acute respiratory distress - Resolved  #4 Abnormal LFTs Likely secondary to probable metastatic liver lesions. -s/p ultrasound-guided biopsy of liver lesions 1/8,  results as noted above  #5 Anemia of chronic disease Hemoglobin 7.7 1/12 and patient was transfused.   #6 Acute renal failure Likely secondary to prerenal azotemia. Renal function improved.  - Creatinine normalized,  IV fluids resumed prior to CT on 1/10 and continue Cozaar for BP control (resumed on 1/9)  -Creatinine remains stable  #7 hypertension Continue Lopressor 75 mg twice daily. Continue Hytrin.  -continue Cozaar. Hydralazine PRN.  #8 Hypercalcemia -Likely secondary to malignancy -Given IV pamidronate and steroid on 1/12 -No significant change in calcium. Continue  dexamethasone  #9 Prophylaxis SCDs for DVT prophylaxis.  #10 Acute encephalopathy-metabolic+/-toxic -Likely  due to hypercalcemia and possible infection  -ammonia level within normal limits  #11 Complex Social Situation Wife, brother, sons are now all on same page. All agree with hospice placement.  Code Status: DNR. Family Communication: No family at bedside Disposition Plan: To Hospice when bed is available. Will discontinue certain medications.   Consultants:  General surgery  Onc  [facility] GI  PMT   Procedures:  Chest x-ray 10/15/2013  Abdominal ultrasound 10/13/2013  CT angiogram of the chest 10/24/2013   HIDA Scan 10/02/2013  VQ scan 10/03/2013  Liver biopsy 1/8  CT abd and pelvis>>1/11  Diagnosis Liver, needle/core biopsy, Right lesion - MUCINOUS ADENOCARCINOMA. - SEE COMMENT. Microscopic Comment Given the radiologic findings, the features favor a primary pancreatobiliary adenocarcinoma. (JBK:ecj  Antibiotics:  IV azithromycin 10/02/2013 >>> 10/02/2013  IV Rocephin 10/02/2013>>>> 10/02/2013  IV Zosyn 10/02/2013>>>> 10/04/2013  IV vancomycin 10/02/2013>>>> 10/04/2013, Vanc restarted on 1/12  Cefepime started on 1/12  HPI/Subjective: Patient is sleepy. Arousable but goes back to sleep. Denies any pain. Confused.   Objective: Filed Vitals:   10/14/13 0624  BP: 160/81  Pulse: 70  Temp: 98.9 F (37.2 C)  Resp: 18    Intake/Output Summary (Last 24 hours) at 10/14/13 1157 Last data filed at 10/14/13 0939  Gross per 24 hour  Intake    120 ml  Output   1000 ml  Net   -880 ml   Filed Weights   10/02/13 0252 10/02/13 0600  Weight: 127.007 kg (280 lb) 127.325 kg (280 lb 11.2 oz)    Exam:   General:  More alert, appropriate  Cardiovascular: RRR  Respiratory: CTAB  Abdomen: Obese, soft, slightly tender in RUQ, nondistended, positive bowel sounds.  Extremities: No clubbing cyanosis or edema.  Data Reviewed: Basic Metabolic Panel:  Recent Labs Lab 10/08/13 0403 10/09/13 0426 10/11/13 0605 10/12/13 0630 10/13/13 0421  NA 139 137 139  138 140  K 4.6 4.6 5.3 5.1 5.1  CL 107 105 107 107 109  CO2 20 22 21 22 20   GLUCOSE 95 123* 128* 151* 132*  BUN 25* 29* 33* 35* 42*  CREATININE 1.20 1.30 1.23 1.18 1.35  CALCIUM 10.5 10.7* 10.9* 11.5* 11.3*   Liver Function Tests:  Recent Labs Lab 10/08/13 0403 10/09/13 0426 10/11/13 0605 10/13/13 0421  AST 107* 139* 154* 166*  ALT 82* 94* 101* 128*  ALKPHOS 282* 350* 378* 387*  BILITOT 1.4* 1.4* 1.4* 1.4*  PROT 6.9 6.9 7.0 7.0  ALBUMIN 2.4* 2.4* 2.4* 2.3*    Recent Labs Lab 10/10/13 1318  AMMONIA 44   CBC:  Recent Labs Lab 10/09/13 0426 10/10/13 1809 10/12/13 0552 10/13/13 0421  WBC 6.4 6.0 9.3 8.3  NEUTROABS 5.0  --   --   --   HGB 7.7* 7.7* 9.4* 9.1*  HCT 23.9* 24.0* 28.6* 28.2*  MCV 91.6 92.3 90.2 91.9  PLT 114* 100* 120* 111*    Recent Results (from the past 240 hour(s))  CULTURE, BLOOD (ROUTINE X 2)     Status: None   Collection Time    10/10/13 10:12 AM      Result Value Range Status   Specimen Description BLOOD LEFT ARM   Final   Special Requests BOTTLES DRAWN AEROBIC AND ANAEROBIC 2CC   Final   Culture  Setup Time     Final   Value: 10/10/2013 14:16     Performed at [facility]  Culture     Final   Value:        BLOOD CULTURE RECEIVED NO GROWTH TO DATE CULTURE WILL BE HELD FOR 5 DAYS BEFORE ISSUING A FINAL NEGATIVE REPORT     Performed at [facility]   Report Status PENDING   Incomplete  CULTURE, BLOOD (ROUTINE X 2)     Status: None   Collection Time    10/10/13 10:21 AM      Result Value Range Status   Specimen Description BLOOD LEFT HAND   Final   Special Requests     Final   Value: BOTTLES DRAWN AEROBIC AND ANAEROBIC AERO 10CC ANA 6CC   Culture  Setup Time     Final   Value: 10/10/2013 14:16     Performed at [facility]   Culture     Final   Value:        BLOOD CULTURE RECEIVED NO GROWTH TO DATE CULTURE WILL BE HELD FOR 5 DAYS BEFORE ISSUING A FINAL NEGATIVE REPORT     Performed at [facility]    Report Status PENDING   Incomplete  URINE CULTURE     Status: None   Collection Time    10/10/13  2:03 PM      Result Value Range Status   Specimen Description URINE, CLEAN CATCH   Final   Special Requests NONE   Final   Culture  Setup Time     Final   Value: 10/10/2013 23:41     Performed at [place]     Final   Value: NO GROWTH     Performed at [facility]   Culture     Final   Value: NO GROWTH     Performed at [facility]   Report Status 10/11/2013 FINAL   Final     Studies: No results found.  Scheduled Meds: . allopurinol  100 mg Oral Daily  . antiseptic oral rinse  15 mL Mouth Rinse BID  . aspirin EC  325 mg Oral Daily  . dexamethasone  4 mg Intravenous Q12H  . feeding supplement (RESOURCE BREEZE)  1 Container Oral BID BM  . losartan  25 mg Oral Daily  . metoprolol  75 mg Oral BID  . senna-docusate  1 tablet Oral BID  . terazosin  20 mg Oral QHS   Continuous Infusions:    Principal Problem:   Abdominal pain, unspecified site Active Problems:   Multiple myeloma   Prostate cancer   Anemia   Renal failure   Liver mass   Splenic mass   Hypertension   Pancreatic cancer   Hypercalcemia   Unspecified protein-calorie malnutrition     Jessalyn Hinojosa M.D. [facility] Pager [phone].   If 7PM-7AM, please contact night-coverage at [email or web address], password [email or web address] 10/14/2013, 11:57 AM  LOS: 13 days

## 2013-10-14 NOTE — Clinical Social Work Note (Addendum)
2:50pm- CSW left message for [place] at [place], [place].  CSW awaiting a return call.  [facility] has no bed availability today.  [facility] - no beds available today.  11:39am- CSW left message with Alyse Low at [facility], SNF and with Robin/Sarah at [facility], [place] regarding possible placement today.  CSW also faxed both [facility] and [facility] clinicals for bed offer review.  CSW awaiting a return call.  11:07am- CSW spoke with pt wife, Gilbert Deleon who states that she is interested [facility] or [facility] in [place] since there seems to be no [facility].  CSW will f/u.  CSW also spoke with pt brother, who is in agreement with wife's decision.  Nonnie Done, [place] [phone]  Clinical Social Work

## 2013-10-14 NOTE — Progress Notes (Signed)
Pt's IV is outdated. Patient's IV site is WNL. Assessed patient to gain another IV site. No IV site found due to poor veins. Previous report stated that pt is a difficult stick and has a history of having to have IVs placed using ultrasound. Notified IV team to put pt on restart list.   Will continue to monitor patient. Ranelle Oyster, RN

## 2013-10-15 MED ORDER — WHITE PETROLATUM GEL
Status: AC
  Administered 2013-10-15: 0.2
  Filled 2013-10-15: qty 5

## 2013-10-15 NOTE — Clinical Social Work Note (Addendum)
10:15am Weekend CSW received a call back from admissions, [place], with [facility]. Alyse Low stated that [facility] currently does NOT have any bed availability and unit CSW to follow-up on Monday for possible bed openings. Weekend CSW followed-up with [facility] (per unit CSW handoff), [facility] is not admitting over weekend. Weekend CSW followed-up with [facility] for in-patient hospice, Mariann Laster with admissions stated that no residential hospices in the area have bed availability at this time. Weekend CSW followed-up with [facility] in-patient hospice, and left voice message with Jenny Reichmann [phone]) with admissions.  Weekend CSW has called and spoken with administration at [facility] [phone]) in [place], [place]. Administration to look into pt's clinical information and call CSW back. CSW will continue to follow and assist with discharge planning needs.  Pati Gallo, [place] Social Worker [phone]

## 2013-10-15 NOTE — Plan of Care (Signed)
Problem: Phase II Progression Outcomes Goal: Discharge plan established Outcome: Completed/Met Date Met:  10/15/13 To SNF

## 2013-10-15 NOTE — Progress Notes (Signed)
[facility] PROGRESS NOTE  Gilbert Deleon PVX:[id number] DOB: [birth year] DOA: 10/11/2013  [facility]  Brief Narrative: Gilbert Deleon is a 79 y.o. male with Past medical history of Multiple myeloma not on any therapy since August 2013, prostate cancer treated with hormonal therapy only. Presented with complaints of shortness of breath worse with exertion for 2-3 months. He also reported right upper quadrant pain>> denied any association with meals. He should also reported chronic constipation, and bloating. He stated that  in 2010 he was diagnosed with Prostate cancer and multiple myeloma. For prostate cancer he has undergone only hormonal therapy without any chemotherapy or radiation or surgery. For multiple myeloma he was placed on thalidomide, mycophenolate, Revlimid with prednisone and since August 2013 he's not taking any medication. All this treatment has been done at [place].  Assessment/Plan: Mucinous Adenocarcinoma/Pancreatic Tumor -Following admission, general surgery was consulted for abnormal appearing gall bladder suggestive of cholecystitis. They saw patient and further workup included a HIDA scan which came back normal and hence no surgery indicated. -Patient had had a CT angiogram of his chest which showed multiple hypervascular metastatic appearing lesions. IR was consulted for biopsy  -Status post liver biopsy on 1/8 and results showing mucinous adeno and the features favor a primary pancreatobiliary adenocarcinoma.  - Seen by Oncology Dr. Alvy Bimler. Family requested transfer to [facility] but no beds were available and patient was wait-listed.  - Above was discussed with pt and family and Dr Alvy Bimler and plan was to continue work up here while waiting for bed. Family was agreeable to this. However in light of new information, as below, there was no need for transfer. -CT of abd and pelvis shows 7.1 x 7.3 cm mass situated at the pancreatic tail and splenic hilum. Question primary pancreatic neoplasm  vs Mets and a 5 x 7 x 11 cm filling defect along the posterior distal sigmoid colon/rectum. Bone scan shows no skeletal metastases. - GI consulted for further recs and pt seen by Dr Ardis Hughs and states that the large sigmoid/rectal filling defect is likely a drop mets vs second primary and recommends no further GI work up at this time.  -Patient discussed with Dr. Alvy Bimler and she states given his performance status at this time she does not recommend transfer anywhere for further treatment. She would not offer any further treatment at this time. She recommends hospice. Patient seen by PMT. They have discussed with patient's brother who is the only one available and based on patient's known wishes. He is now DNR and hospice eligible. CSW assisting with placement. -Wife did finally arrive and now agrees with above plan.   Fever/probable HAP - cxr with worsening of interstitial edema or interstitial infiltrates. Could have aspirated. -UA negative -Blood cultures so far with no growth -Patient defervesced with empiric antibiotics for pneumonia -Now off abx. Was treated with vancomycin and cefepime.  Abnormal LFTs Likely secondary to probable metastatic liver lesions. -s/p ultrasound-guided biopsy of liver lesions 1/8,  results as noted above  Anemia of chronic disease Hemoglobin 7.7 1/12 and patient was transfused.   Acute renal failure Likely secondary to prerenal azotemia. Renal function improved.  - Creatinine normalized,  IV fluids resumed prior to CT on 1/10 and continue Cozaar for BP control (resumed on 1/9)   Hypertension BP continues to be high. Patient apparently refusing meds. Continue Lopressor 75 mg twice daily. Hydralazine PRN. Will not be aggressive.  Hypercalcemia -Likely secondary to malignancy -Given IV pamidronate and steroid on 1/12. Now off dexamethasone. -  No significant change in calcium. No labs will be checked anymore as he is hospice candidate.  Prophylaxis SCDs for  DVT prophylaxis.  Acute encephalopathy-metabolic+/-toxic -Likely due to hypercalcemia and possible infection  -ammonia level within normal limits  Complex Social Situation Initially wife and brother wanted different things for the patient. Wife was in [place] and brother was here locally. But now wife, brother, sons are all on same page. All agree with hospice placement.  Code Status: DNR. Family Communication: No family at bedside Disposition Plan: To Hospice when bed is available.    Consultants:  General surgery  Onc  [facility] GI  PMT  Procedures:  Chest x-ray 10/21/2013  Abdominal ultrasound 10/12/2013  CT angiogram of the chest 09/30/2013   HIDA Scan 10/02/2013  VQ scan 10/03/2013  Liver biopsy 1/8  CT abd and pelvis>>1/11  Diagnosis Liver, needle/core biopsy, Right lesion - MUCINOUS ADENOCARCINOMA. - SEE COMMENT. Microscopic Comment Given the radiologic findings, the features favor a primary pancreatobiliary adenocarcinoma. (JBK:ecj  Antibiotics:  IV azithromycin 10/02/2013 >>> 10/02/2013  IV Rocephin 10/02/2013>>>> 10/02/2013  IV Zosyn 10/02/2013>>>> 10/04/2013  IV vancomycin 10/02/2013>>>> 10/04/2013, Vanc restarted on 1/12 stopped 1/15  Cefepime started on 1/12 stopped 1/15  HPI/Subjective: Patient denies pain. No complaints.  Objective: Filed Vitals:   10/15/13 0725  BP: 153/87  Pulse:   Temp:   Resp:     Intake/Output Summary (Last 24 hours) at 10/15/13 0749 Last data filed at 10/15/13 0540  Gross per 24 hour  Intake    180 ml  Output   1425 ml  Net  -1245 ml   Filed Weights   10/02/13 0252 10/02/13 0600  Weight: 127.007 kg (280 lb) 127.325 kg (280 lb 11.2 oz)    Exam:   General:  No distress  Cardiovascular: RRR  Respiratory: CTAB  Abdomen: Obese, soft, slightly tender in RUQ, nondistended, positive bowel sounds.  Extremities: No edema.  Data Reviewed: Basic Metabolic Panel:  Recent Labs Lab 10/09/13 0426  10/11/13 0605 10/12/13 0630 10/13/13 0421  NA 137 139 138 140  K 4.6 5.3 5.1 5.1  CL 105 107 107 109  CO2 22 21 22 20   GLUCOSE 123* 128* 151* 132*  BUN 29* 33* 35* 42*  CREATININE 1.30 1.23 1.18 1.35  CALCIUM 10.7* 10.9* 11.5* 11.3*   Liver Function Tests:  Recent Labs Lab 10/09/13 0426 10/11/13 0605 10/13/13 0421  AST 139* 154* 166*  ALT 94* 101* 128*  ALKPHOS 350* 378* 387*  BILITOT 1.4* 1.4* 1.4*  PROT 6.9 7.0 7.0  ALBUMIN 2.4* 2.4* 2.3*    Recent Labs Lab 10/10/13 1318  AMMONIA 44   CBC:  Recent Labs Lab 10/09/13 0426 10/10/13 1809 10/12/13 0552 10/13/13 0421  WBC 6.4 6.0 9.3 8.3  NEUTROABS 5.0  --   --   --   HGB 7.7* 7.7* 9.4* 9.1*  HCT 23.9* 24.0* 28.6* 28.2*  MCV 91.6 92.3 90.2 91.9  PLT 114* 100* 120* 111*    Recent Results (from the past 240 hour(s))  CULTURE, BLOOD (ROUTINE X 2)     Status: None   Collection Time    10/10/13 10:12 AM      Result Value Range Status   Specimen Description BLOOD LEFT ARM   Final   Special Requests BOTTLES DRAWN AEROBIC AND ANAEROBIC 2CC   Final   Culture  Setup Time     Final   Value: 10/10/2013 14:16     Performed at [facility]  Final   Value:        BLOOD CULTURE RECEIVED NO GROWTH TO DATE CULTURE WILL BE HELD FOR 5 DAYS BEFORE ISSUING A FINAL NEGATIVE REPORT     Performed at [facility]   Report Status PENDING   Incomplete  CULTURE, BLOOD (ROUTINE X 2)     Status: None   Collection Time    10/10/13 10:21 AM      Result Value Range Status   Specimen Description BLOOD LEFT HAND   Final   Special Requests     Final   Value: BOTTLES DRAWN AEROBIC AND ANAEROBIC AERO 10CC ANA 6CC   Culture  Setup Time     Final   Value: 10/10/2013 14:16     Performed at [facility]   Culture     Final   Value:        BLOOD CULTURE RECEIVED NO GROWTH TO DATE CULTURE WILL BE HELD FOR 5 DAYS BEFORE ISSUING A FINAL NEGATIVE REPORT     Performed at [facility]   Report Status  PENDING   Incomplete  URINE CULTURE     Status: None   Collection Time    10/10/13  2:03 PM      Result Value Range Status   Specimen Description URINE, CLEAN CATCH   Final   Special Requests NONE   Final   Culture  Setup Time     Final   Value: 10/10/2013 23:41     Performed at [place]     Final   Value: NO GROWTH     Performed at [facility]   Culture     Final   Value: NO GROWTH     Performed at [facility]   Report Status 10/11/2013 FINAL   Final     Studies: No results found.  Scheduled Meds: . antiseptic oral rinse  15 mL Mouth Rinse BID  . feeding supplement (RESOURCE BREEZE)  1 Container Oral BID BM  . metoprolol  75 mg Oral BID  . senna-docusate  1 tablet Oral BID   Continuous Infusions:    Principal Problem:   Abdominal pain, unspecified site Active Problems:   Multiple myeloma   Prostate cancer   Anemia   Renal failure   Liver mass   Splenic mass   Hypertension   Pancreatic cancer   Hypercalcemia   Unspecified protein-calorie malnutrition     Celestine Prim M.D. [facility] Pager [phone].   If 7PM-7AM, please contact night-coverage at [email or web address], password [facility] 10/15/2013, 7:49 AM  LOS: 14 days

## 2013-10-15 NOTE — Progress Notes (Signed)
Spoke with patient's son, Remon Quinto, and brother, Dr. Wynetta Emery. They would also like to say that a SNF with hospice service in [place] would also be ok.

## 2013-10-16 LAB — CULTURE, BLOOD (ROUTINE X 2)
Culture: NO GROWTH
Culture: NO GROWTH

## 2013-10-16 NOTE — Progress Notes (Signed)
[facility] PROGRESS NOTE  Gilbert Deleon:[id number] DOB: May 08, 1935 DOA: 10/14/2013  [facility]  Brief Narrative: Gilbert Deleon is a 79 y.o. male with Past medical history of Multiple myeloma not on any therapy since August 2013, prostate cancer treated with hormonal therapy only. Presented with complaints of shortness of breath worse with exertion for 2-3 months. He also reported right upper quadrant pain>> denied any association with meals. He should also reported chronic constipation, and bloating. He stated that  in 2010 he was diagnosed with Prostate cancer and multiple myeloma. For prostate cancer he has undergone only hormonal therapy without any chemotherapy or radiation or surgery. For multiple myeloma he was placed on thalidomide, mycophenolate, Revlimid with prednisone and since August 2013 he's not taking any medication. All this treatment has been done at [place].  Assessment/Plan: Mucinous Adenocarcinoma/Pancreatic Tumor -Following admission, general surgery was consulted for abnormal appearing gall bladder suggestive of cholecystitis. They saw patient and further workup included a HIDA scan which came back normal and hence no surgery indicated. -Patient had had a CT angiogram of his chest which showed multiple hypervascular metastatic appearing lesions. IR was consulted for biopsy  -Status post liver biopsy on 1/8 and results showing mucinous adeno and the features favor a primary pancreatobiliary adenocarcinoma.  - Seen by Oncology Dr. Alvy Bimler. Family requested transfer to [facility] but no beds were available and patient was wait-listed.  - Above was discussed with pt and family and Dr Alvy Bimler and plan was to continue work up here while waiting for bed. Family was agreeable to this. However in light of new information, as below, there was no need for transfer. -CT of abd and pelvis shows 7.1 x 7.3 cm mass situated at the pancreatic tail and splenic hilum. Question primary pancreatic neoplasm  vs Mets and a 5 x 7 x 11 cm filling defect along the posterior distal sigmoid colon/rectum. Bone scan shows no skeletal metastases. - GI consulted for further recs and pt seen by Dr Ardis Hughs and states that the large sigmoid/rectal filling defect is likely a drop mets vs second primary and recommends no further GI work up at this time.  -Patient discussed with Dr. Alvy Bimler and she states given his performance status at this time she does not recommend transfer anywhere for further treatment. She would not offer any further treatment at this time. She recommends hospice. Patient seen by PMT. They have discussed with patient's brother who is the only one available and based on patient's known wishes. He is now DNR and hospice eligible. CSW assisting with placement. -Wife did finally arrive and now agrees with above plan.   Fever/probable HAP - cxr with worsening of interstitial edema or interstitial infiltrates. Could have aspirated. -UA negative -Blood cultures so far with no growth -Patient defervesced with empiric antibiotics for pneumonia -Now off abx. Was treated with vancomycin and cefepime.  Abnormal LFTs Likely secondary to probable metastatic liver lesions. -s/p ultrasound-guided biopsy of liver lesions 1/8,  results as noted above  Anemia of chronic disease Hemoglobin 7.7 1/12 and patient was transfused.   Acute renal failure Likely secondary to prerenal azotemia. Renal function improved.  - Creatinine normalized,  IV fluids resumed prior to CT on 1/10 and continue Cozaar for BP control (resumed on 1/9) No further labs  Hypertension BP continues to be high. Patient apparently refusing meds. Continue Lopressor 75 mg twice daily. Hydralazine PRN. Will not be aggressive.  Hypercalcemia -Likely secondary to malignancy -Given IV pamidronate and steroid on 1/12. Now  off dexamethasone. -No significant change in calcium.  No labs will be checked anymore as he is hospice  candidate.  Prophylaxis SCDs for DVT prophylaxis.  Acute encephalopathy-metabolic+/-toxic -Likely due to hypercalcemia and possible infection  -ammonia level within normal limits  Complex Social Situation Initially wife and brother wanted different things for the patient. Wife was in [place] and brother was here locally. But now wife, brother, sons are all on same page. All agree with hospice placement.  Code Status: DNR. Family Communication: No family at bedside Disposition Plan: To Hospice when bed is available. Might have to pursue SNF with hospice. CSW following.   Consultants:  General surgery  Onc  [facility]  PMT  Procedures:  Chest x-ray 09/30/2013  Abdominal ultrasound 10/12/2013  CT angiogram of the chest 10/04/2013   HIDA Scan 10/02/2013  VQ scan 10/03/2013  Liver biopsy 1/8  CT abd and pelvis>>1/11  Diagnosis Liver, needle/core biopsy, Right lesion - MUCINOUS ADENOCARCINOMA. - SEE COMMENT. Microscopic Comment Given the radiologic findings, the features favor a primary pancreatobiliary adenocarcinoma. (JBK:ecj  Antibiotics:  IV azithromycin 10/02/2013 >>> 10/02/2013  IV Rocephin 10/02/2013>>>> 10/02/2013  IV Zosyn 10/02/2013>>>> 10/04/2013  IV vancomycin 10/02/2013>>>> 10/04/2013, Vanc restarted on 1/12 stopped 1/15  Cefepime started on 1/12 stopped 1/15  HPI/Subjective: Patient had pain earlier but resolved with meds.   Objective: Filed Vitals:   10/16/13 0534  BP: 146/74  Pulse: 83  Temp: 98.7 F (37.1 C)  Resp: 18    Intake/Output Summary (Last 24 hours) at 10/16/13 1135 Last data filed at 10/16/13 0900  Gross per 24 hour  Intake      0 ml  Output    850 ml  Net   -850 ml   Filed Weights   10/02/13 0252 10/02/13 0600  Weight: 127.007 kg (280 lb) 127.325 kg (280 lb 11.2 oz)    Exam:   General:  No distress  Cardiovascular: RRR  Respiratory: CTAB  Abdomen: Obese, soft, slightly tender in RUQ, nondistended, positive  bowel sounds.  Extremities: No edema.  Data Reviewed: Basic Metabolic Panel:  Recent Labs Lab 10/11/13 0605 10/12/13 0630 10/13/13 0421  NA 139 138 140  K 5.3 5.1 5.1  CL 107 107 109  CO2 21 22 20   GLUCOSE 128* 151* 132*  BUN 33* 35* 42*  CREATININE 1.23 1.18 1.35  CALCIUM 10.9* 11.5* 11.3*   Liver Function Tests:  Recent Labs Lab 10/11/13 0605 10/13/13 0421  AST 154* 166*  ALT 101* 128*  ALKPHOS 378* 387*  BILITOT 1.4* 1.4*  PROT 7.0 7.0  ALBUMIN 2.4* 2.3*    Recent Labs Lab 10/10/13 1318  AMMONIA 44   CBC:  Recent Labs Lab 10/10/13 1809 10/12/13 0552 10/13/13 0421  WBC 6.0 9.3 8.3  HGB 7.7* 9.4* 9.1*  HCT 24.0* 28.6* 28.2*  MCV 92.3 90.2 91.9  PLT 100* 120* 111*    Recent Results (from the past 240 hour(s))  CULTURE, BLOOD (ROUTINE X 2)     Status: None   Collection Time    10/10/13 10:12 AM      Result Value Range Status   Specimen Description BLOOD LEFT ARM   Final   Special Requests BOTTLES DRAWN AEROBIC AND ANAEROBIC 2CC   Final   Culture  Setup Time     Final   Value: 10/10/2013 14:16     Performed at [facility]   Culture     Final   Value: NO GROWTH 5 DAYS  Performed at [facility]   Report Status 10/16/2013 FINAL   Final  CULTURE, BLOOD (ROUTINE X 2)     Status: None   Collection Time    10/10/13 10:21 AM      Result Value Range Status   Specimen Description BLOOD LEFT HAND   Final   Special Requests     Final   Value: BOTTLES DRAWN AEROBIC AND ANAEROBIC AERO 10CC ANA [place]   Culture  Setup Time     Final   Value: 10/10/2013 14:16     Performed at [facility]   Culture     Final   Value: NO GROWTH 5 DAYS     Performed at [facility]   Report Status 10/16/2013 FINAL   Final  URINE CULTURE     Status: None   Collection Time    10/10/13  2:03 PM      Result Value Range Status   Specimen Description URINE, CLEAN CATCH   Final   Special Requests NONE   Final   Culture  Setup Time      Final   Value: 10/10/2013 23:41     Performed at [place]     Final   Value: NO GROWTH     Performed at [facility]   Culture     Final   Value: NO GROWTH     Performed at [facility]   Report Status 10/11/2013 FINAL   Final     Studies: No results found.  Scheduled Meds: . antiseptic oral rinse  15 mL Mouth Rinse BID  . feeding supplement (RESOURCE BREEZE)  1 Container Oral BID BM  . metoprolol  75 mg Oral BID  . senna-docusate  1 tablet Oral BID   Continuous Infusions:    Principal Problem:   Abdominal pain, unspecified site Active Problems:   Multiple myeloma   Prostate cancer   Anemia   Renal failure   Liver mass   Splenic mass   Hypertension   Pancreatic cancer   Hypercalcemia   Unspecified protein-calorie malnutrition     Grisel Blumenstock M.D. [facility] Pager [phone].   If 7PM-7AM, please contact night-coverage at [email or web address], password [facility] 10/16/2013, 11:35 AM  LOS: 15 days

## 2013-10-16 NOTE — Progress Notes (Signed)
CSW spoke with [facility] and sent clinicals to them today.  CSW spoke with pt's family at bedside and discussed looking into SNFs in the [place] area.  CSW will fax out to this area if it has not been already.  CSW will continue to contact [facility] to see about accepting pt.  CSW continues to follow for discharge planning.  [phone] (weekend CSW)

## 2013-10-16 NOTE — Progress Notes (Signed)
10/16/13 Patient not as alert as on 1/17. Refusing to eat or drink. Family friends into visit.

## 2013-10-17 NOTE — Progress Notes (Signed)
Son stated his father attempted to wear CPAP mask last night and did not tolerate well. Son also stated the CPAP is something that his father wears occasionally. Encouraged to call RT if pt wanted to wear mask. Pt resting comfortably at this time. No distress noted.

## 2013-10-17 NOTE — Progress Notes (Signed)
Nutrition Brief Note  Chart reviewed. Team is recommending hospice; plan is for pt to d/c to inpatient hospice facility when bed is available. No further nutrition interventions warranted at this time.  Please re-consult as needed.   Inda Coke MS, RD, LDN Pager: [phone] After-hours pager: [phone]

## 2013-10-17 NOTE — Consult Note (Signed)
[place] Liaison: Received request from [place] for family interest in [facility]. Understand from [place] residential in another area is preference and family still hopeful to be closer to home. Unfortunately no [facility] availability at this time. Will update CSW if availability changes. Thank you. Erling Conte LCSW [phone]

## 2013-10-17 NOTE — Progress Notes (Signed)
CSW spoke with Pt's brother  Burnett Harry [phone])  Concerning progress with placement. CSW informed Pt's brother that multiple individuals are working with Pt's family on placement. Pt's brother was appreciative and stated that they were awaiting a call back. CSW assured family that all measures are being taken for assistance with placement in a timely manner and that CSW was awaiting multiple call backs in order to provide viable options for the family.   CSW will update unit CSW of progress and plan for d/c to a residential facility tomorrow if bed available and Pt accepted. Per MD not, SNF placement is not an option at this time. CM is looking into the possibility of GIP for Pt.      [facility]  4N 1-16;  [phone] Phone: [phone]

## 2013-10-17 NOTE — Progress Notes (Addendum)
CSW spoke with Pt's son concerning Residential Hospice Placement.  Son is following up on placement options. CSW explained that CSW will follow-up with placement options and contact Pt's son back.   CSW will continue to follow Pt for d/c planning.    [facility]  4N 1-16;  [phone] Phone: [phone]

## 2013-10-17 NOTE — Progress Notes (Addendum)
Call placed to medical director at [facility] return call. This patient is not appropriate for transfer to SNF, he is approaching EOL and needs close attention to his comfort and symptom management that can only be achieved with inpatient hospice care.  [address] [place] [facility], [place] [address]   [place] at the [place],  [id number]  [phone]  Would also do [place] search in areas surrounding [place]...ie [place], [place], [place]...etc.   Awaiting return call.   Lane Hacker, DO Palliative Medicine

## 2013-10-17 NOTE — Progress Notes (Addendum)
[facility] PROGRESS NOTE  Gilbert Deleon MVH:[id number] DOB: [birth year] DOA: 09/29/2013  [facility]  Brief Narrative: Gilbert Deleon is a 79 y.o. male with Past medical history of Multiple myeloma not on any therapy since August 2013, prostate cancer treated with hormonal therapy only. Presented with complaints of shortness of breath worse with exertion for 2-3 months. He also reported right upper quadrant pain>> denied any association with meals. He should also reported chronic constipation, and bloating. He stated that  in 2010 he was diagnosed with Prostate cancer and multiple myeloma. For prostate cancer he has undergone only hormonal therapy without any chemotherapy or radiation or surgery. For multiple myeloma he was placed on thalidomide, mycophenolate, Revlimid with prednisone and since August 2013 he's not taking any medication. All this treatment has been done at [place].  Assessment/Plan: Mucinous Adenocarcinoma/Pancreatic Tumor -Following admission, general surgery was consulted for abnormal appearing gall bladder suggestive of cholecystitis. They saw patient and further workup included a HIDA scan which came back normal and hence no surgery indicated. -Patient had had a CT angiogram of his chest which showed multiple hypervascular metastatic appearing lesions. IR was consulted for biopsy  -Status post liver biopsy on 1/8 and results showing mucinous adeno and the features favor a primary pancreatobiliary adenocarcinoma.  - Seen by Oncology Dr. Alvy Bimler. Family requested transfer to [facility] but no beds were available and patient was wait-listed.  - Above was discussed with pt and family and Dr Alvy Bimler and plan was to continue work up here while waiting for bed. Family was agreeable to this. However in light of new information, as below, there was no need for transfer. -CT of abd and pelvis shows 7.1 x 7.3 cm mass situated at the pancreatic tail and splenic hilum. Question primary pancreatic neoplasm  vs Mets and a 5 x 7 x 11 cm filling defect along the posterior distal sigmoid colon/rectum. Bone scan shows no skeletal metastases. - GI consulted for further recs and pt seen by Dr Ardis Hughs and states that the large sigmoid/rectal filling defect is likely a drop mets vs second primary and recommends no further GI work up at this time.  -Patient discussed with Dr. Alvy Bimler and she states given his performance status at this time she does not recommend transfer anywhere for further treatment. She would not offer any further treatment at this time. She recommends hospice. Patient seen by PMT. They have discussed with patient's brother who is the only one available and based on patient's known wishes. He is now DNR and hospice eligible. CSW assisting with placement. -Wife did finally arrive and now agrees with above plan.   Fever/probable HAP - cxr with worsening of interstitial edema or interstitial infiltrates. Could have aspirated. -UA negative -Blood cultures so far with no growth -Patient defervesced with empiric antibiotics for pneumonia -Now off abx. Was treated initially with vancomycin and cefepime.  Abnormal LFTs Likely secondary to probable metastatic liver lesions. -s/p ultrasound-guided biopsy of liver lesions 1/8,  results as noted above  Anemia of chronic disease Hemoglobin 7.7 1/12 and patient was transfused.   Acute renal failure Likely secondary to prerenal azotemia. Renal function improved. No further labs  Hypertension BP is better. Continue Lopressor 75 mg twice daily. Hydralazine PRN. Will not be aggressive.  Hypercalcemia -Likely secondary to malignancy -Given IV pamidronate and steroid on 1/12. Now off dexamethasone. -No significant change in calcium.  No labs will be checked anymore as he is hospice patient.  Prophylaxis SCDs for DVT prophylaxis.  Acute  encephalopathy-metabolic+/-toxic -Likely due to hypercalcemia and possible infection  -ammonia level within  normal limits  Complex Social Situation Initially wife and brother wanted different things for the patient. Wife was in [place] and brother was here locally. But now wife, brother, sons are all on same page. All agree with hospice placement.  Code Status: DNR. Family Communication: No family at bedside Disposition Plan: To Hospice when bed is available. Might have to pursue SNF with hospice. CSW following.   Consultants:  General surgery  Onc  [facility] GI  PMT  Procedures:  Chest x-ray 10/09/2013  Abdominal ultrasound 10/23/2013  CT angiogram of the chest 10/28/2013   HIDA Scan 10/02/2013  VQ scan 10/03/2013  Liver biopsy 1/8  CT abd and pelvis>>1/11  Diagnosis Liver, needle/core biopsy, Right lesion - MUCINOUS ADENOCARCINOMA. - SEE COMMENT. Microscopic Comment Given the radiologic findings, the features favor a primary pancreatobiliary adenocarcinoma. (JBK:ecj  Antibiotics:  IV azithromycin 10/02/2013 >>> 10/02/2013  IV Rocephin 10/02/2013>>>> 10/02/2013  IV Zosyn 10/02/2013>>>> 10/04/2013  IV vancomycin 10/02/2013>>>> 10/04/2013, Vanc restarted on 1/12 stopped 1/15  Cefepime started on 1/12 stopped 1/15  HPI/Subjective: Patient slept well. Still drowsy. Son at bedside. No pain overnight.   Objective: Filed Vitals:   10/17/13 0518  BP: 115/75  Pulse: 98  Temp: 98.4 F (36.9 C)  Resp: 20    Intake/Output Summary (Last 24 hours) at 10/17/13 0751 Last data filed at 10/17/13 0421  Gross per 24 hour  Intake      0 ml  Output    700 ml  Net   -700 ml   Filed Weights   10/02/13 0252 10/02/13 0600  Weight: 127.007 kg (280 lb) 127.325 kg (280 lb 11.2 oz)    Exam:   General:  No distress, more lethargic  Cardiovascular: RRR  Respiratory: CTAB  Abdomen: Obese, soft, slightly tender in RUQ, nondistended, positive bowel sounds.   Data Reviewed: Basic Metabolic Panel:  Recent Labs Lab 10/11/13 0605 10/12/13 0630 10/13/13 0421  NA 139 138 140   K 5.3 5.1 5.1  CL 107 107 109  CO2 _0 GLUCOSE 128* 151* 132*  BUN 33* 35* 42*  CREATININE 1.23 1.18 1.35  CALCIUM 10.9* 11.5* 11.3*   Liver Function Tests:  Recent Labs Lab 10/11/13 0605 10/13/13 0421  AST 154* 166*  ALT 101* 128*  ALKPHOS 378* 387*  BILITOT 1.4* 1.4*  PROT 7.0 7.0  ALBUMIN 2.4* 2.3*    Recent Labs Lab 10/10/13 1318  AMMONIA 44   CBC:  Recent Labs Lab 10/10/13 1809 10/12/13 0552 10/13/13 0421  WBC 6.0 9.3 8.3  HGB 7.7* 9.4* 9.1*  HCT 24.0* 28.6* 28.2*  MCV 92.3 90.2 91.9  PLT 100* 120* 111*    Recent Results (from the past 240 hour(s))  CULTURE, BLOOD (ROUTINE X 2)     Status: None   Collection Time    10/10/13 10:12 AM      Result Value Range Status   Specimen Description BLOOD LEFT ARM   Final   Special Requests BOTTLES DRAWN AEROBIC AND ANAEROBIC 2CC   Final   Culture  Setup Time     Final   Value: 10/10/2013 14:16     Performed at [facility]   Culture     Final   Value: NO GROWTH 5 DAYS     Performed at [facility]   Report Status 10/16/2013 FINAL   Final  CULTURE, BLOOD (ROUTINE X 2)  Status: None   Collection Time    10/10/13 10:21 AM      Result Value Range Status   Specimen Description BLOOD LEFT HAND   Final   Special Requests     Final   Value: BOTTLES DRAWN AEROBIC AND ANAEROBIC AERO 10CC ANA 6CC   Culture  Setup Time     Final   Value: 10/10/2013 14:16     Performed at [facility]   Culture     Final   Value: NO GROWTH 5 DAYS     Performed at [facility]   Report Status 10/16/2013 FINAL   Final  URINE CULTURE     Status: None   Collection Time    10/10/13  2:03 PM      Result Value Range Status   Specimen Description URINE, CLEAN CATCH   Final   Special Requests NONE   Final   Culture  Setup Time     Final   Value: 10/10/2013 23:41     Performed at [place]     Final   Value: NO GROWTH     Performed at [facility]   Culture      Final   Value: NO GROWTH     Performed at [facility]   Report Status 10/11/2013 FINAL   Final     Studies: No results found.  Scheduled Meds: . antiseptic oral rinse  15 mL Mouth Rinse BID  . feeding supplement (RESOURCE BREEZE)  1 Container Oral BID BM  . metoprolol  75 mg Oral BID  . senna-docusate  1 tablet Oral BID   Continuous Infusions:    Principal Problem:   Abdominal pain, unspecified site Active Problems:   Multiple myeloma   Prostate cancer   Anemia   Renal failure   Liver mass   Splenic mass   Hypertension   Pancreatic cancer   Hypercalcemia   Unspecified protein-calorie malnutrition     Cindra Austad M.D. [facility] Pager [phone].   If 7PM-7AM, please contact night-coverage at [email or web address], password [facility] 10/17/2013, 7:51 AM  LOS: 16 days

## 2013-10-18 ENCOUNTER — Telehealth: Payer: Self-pay | Admitting: *Deleted

## 2013-10-18 DIAGNOSIS — C9000 Multiple myeloma not having achieved remission: Secondary | ICD-10-CM

## 2013-10-18 DIAGNOSIS — J189 Pneumonia, unspecified organism: Secondary | ICD-10-CM

## 2013-10-18 MED ORDER — SENNA-DOCUSATE SODIUM 8.6-50 MG PO TABS: 1.0000 | ORAL_TABLET | Freq: Two times a day (BID) | ORAL | Status: AC

## 2013-10-18 MED ORDER — METOPROLOL TARTRATE 50 MG PO TABS: 75.0000 mg | ORAL_TABLET | Freq: Two times a day (BID) | ORAL | Status: AC

## 2013-10-18 MED ORDER — SCOPOLAMINE 1 MG/3DAYS TD PT72
1.0000 | MEDICATED_PATCH | TRANSDERMAL | Status: DC
  Administered 2013-10-18: 1.5 mg via TRANSDERMAL
  Filled 2013-10-18: qty 1

## 2013-10-18 MED ORDER — ATROPINE SULFATE 1 % OP SOLN
2.0000 [drp] | OPHTHALMIC | Status: DC | PRN
  Filled 2013-10-18: qty 2

## 2013-10-18 MED ORDER — LORAZEPAM 2 MG/ML IJ SOLN: 0.5000 mg | INTRAMUSCULAR | Status: DC | PRN

## 2013-10-18 MED ORDER — LATANOPROST 0.005 % OP SOLN: 1.0000 [drp] | Freq: Every day | OPHTHALMIC | Status: AC

## 2013-10-18 MED ORDER — SODIUM CHLORIDE 0.9 % IV SOLN
1.0000 mg/h | INTRAVENOUS | Status: DC
  Administered 2013-10-18: 21:00:00 1 mg/h via INTRAVENOUS
  Filled 2013-10-18: qty 10

## 2013-10-18 MED ORDER — MORPHINE SULFATE 2 MG/ML IJ SOLN: 2.0000 mg | INTRAMUSCULAR | Status: DC | PRN

## 2013-10-18 NOTE — Progress Notes (Signed)
Palliative Medicine Team Progress Note  Mr. Dewey has taken a dramatic decline today as predicted. He is this evening unresponsive, he is having significant dyspnea and is using accessory muscles to breathe on my evaluation. His son is at bedside. Per son he started to have very labored breathing about an hour or so ago and it has been progressive. Patient having difficulty with oral secretions. Goals are comfort.   Filed Vitals:   10/18/13 1314  BP: 125/72  Pulse: 128  Temp: 98.7 F (37.1 C)  Resp: 22   Diffuse rhonchi, using accessory muscle to breathe, congestion, drooling, eyes are rolled back, unresponsive, 2+ extremity edema.  79 yo actively dying of Multiple Myeloma, Pancreatic Cancer and Prostate cancer. Worsening dyspnea and mental status.   Will initiate a Morphine infusion at 29m/hr with orders for bolus dosing and titration  Continue to use PRN Ativan for agitation  Nasal cannula O2 prn for comfort only, no CPAP-he has always refused this.  At this point I do not think he is stable for transport-this can be re-evaulated in AM, but if he continues to deteriorate I would recommend referral to one of the local Hospices [place] here in the hospital.   Atropine and [place] for secretions.  25 minutes. Greater than 50% of this time was spent counseling and coordinating care related to the above assessment and plan.   ELane Hacker DO Palliative Medicine

## 2013-10-18 NOTE — Discharge Summary (Signed)
[facility]  Physician Discharge Summary   Patient ID: Gilbert Deleon MRN: [id number] DOB/AGE: 05-03-1935 79 y.o.  Admit date: 10/21/2013 Discharge date: 10/18/2013  PCP: No PCP Per Patient  DISCHARGE DIAGNOSES:  Principal Problem:   Abdominal pain, unspecified site Active Problems:   Multiple myeloma   Prostate cancer   Anemia   Renal failure   Liver mass   Splenic mass   Hypertension   Pancreatic cancer   Hypercalcemia   Unspecified protein-calorie malnutrition   RECOMMENDATIONS FOR OUTPATIENT FOLLOW UP: 1. Patient to be kept comfortable.   DISCHARGE CONDITION: poor  Diet recommendation: Comfort feeds  Filed Weights   10/02/13 0252 10/02/13 0600  Weight: 127.007 kg (280 lb) 127.325 kg (280 lb 11.2 oz)    INITIAL HISTORY: Gilbert Deleon is a 79 y.o. male with Past medical history of Multiple myeloma not on any therapy since August 2013, prostate cancer treated with hormonal therapy only. Presented with complaints of shortness of breath worse with exertion for 2-3 months. He also reported right upper quadrant pain but denied any association with meals. He should also reported chronic constipation, and bloating. He stated that in 2010 he was diagnosed with Prostate cancer and multiple myeloma. For prostate cancer he has undergone only hormonal therapy without any chemotherapy or radiation or surgery. For multiple myeloma he was placed on thalidomide, mycophenolate, Revlimid with prednisone and since August 2013 he's not taking any medication. All this treatment has been done at [place].  Consultants:  General surgery  Onc  [facility]  PMT  Procedures:  Chest x-ray 10/07/2013  Abdominal ultrasound 09/29/2013  CT angiogram of the chest 10/03/2013  HIDA Scan 10/02/2013  VQ scan 10/03/2013  Liver biopsy 1/8  CT abd and pelvis>>1/11  Diagnosis  Liver, needle/core biopsy, Right lesion  - MUCINOUS ADENOCARCINOMA.  - SEE COMMENT.  Microscopic Comment  Given the  radiologic findings, the features favor a primary pancreatobiliary adenocarcinoma. ([place] COURSE:   Mucinous Adenocarcinoma/Pancreatic Tumor  -Following admission, general surgery was consulted for abnormal appearing gall bladder suggestive of cholecystitis. They saw patient and further workup included a HIDA scan which came back normal and hence no surgery indicated.  -Patient had a CT angiogram of his chest which showed multiple hypervascular metastatic appearing lesions. IR was consulted for biopsy  -Status post liver biopsy on 1/8 and results showing mucinous adenocarcinoma and the features favor a primary pancreatobiliary adenocarcinoma.  - Seen by Oncology Dr. Alvy Bimler. Family requested transfer to [facility] but no beds were available and patient was wait-listed.  - Above was discussed with pt and family and Dr Alvy Bimler and plan was to continue work up here while waiting for bed. Family was agreeable to this. However in light of new information, as below, there was no need for transfer.  -CT of abd and pelvis shows 7.1 x 7.3 cm mass situated at the pancreatic tail and splenic hilum. Question primary pancreatic neoplasm vs Mets and a 5 x 7 x 11 cm filling defect along the posterior distal sigmoid colon/rectum. Bone scan shows no skeletal metastases.  - GI consulted for further recs regarding the lesion in colon and and pt seen by Dr Ardis Hughs. He stated that the large sigmoid/rectal filling defect is likely a drop mets vs second primary and recommends no further GI work up at this time.  -Patient discussed with Dr. Alvy Bimler and she states given his performance status at this time she does not recommend transfer anywhere for further treatment. She would  not offer any further treatment at this time. She recommended hospice. Patient seen by PMT. They have discussed with patient's brother who was the only one available initially and based on patient's known wishes. He is now DNR and hospice eligible.  CSW assisting with placement.  -Wife did finally arrive and now agrees with above plan.   Fever/probable HAP  CXR with worsening of interstitial edema or interstitial infiltrates. Could have aspirated. He was initially on broad spectrum antibiotic coverage but once he was thought to be terminal all treatment was stopped.   Abnormal LFTs  Likely secondary to probable metastatic liver lesions. S/p ultrasound-guided biopsy of liver lesions 1/8, results as noted above.  Anemia of chronic disease  Hemoglobin 7.7 on 1/12 and patient was transfused. Labs not checked subsequently as patient is hospice/comfort care.  Acute renal failure  Likely secondary to prerenal azotemia. Renal function improved. No further labs   Hypertension  BP is better. Continue Lopressor 75 mg twice daily. Hydralazine PRN. Will not be aggressive.   Hypercalcemia  Likely secondary to malignancy. Given IV pamidronate and steroid on 1/12. Now off dexamethasone. No significant change in calcium. No labs will be checked anymore as he is hospice patient.   Acute encephalopathy-metabolic+/-toxic  Likely due to hypercalcemia and possible infection. Ammonia level within normal limits. Patient mental status is worsening progressively.  Complex Social Situation  Initially wife and brother wanted different things for the patient. Wife was in [place] and brother was here locally. But now wife, brother, sons are all on same page. All agree with hospice placement.   Code Status: DNR.  We await bed at residential hospice. He can be discharged once bed becomes available.   PERTINENT LABS: The results of significant diagnostics from this hospitalization (including imaging, microbiology, ancillary and laboratory) are listed below for reference.    Microbiology: Recent Results (from the past 240 hour(s))  CULTURE, BLOOD (ROUTINE X 2)     Status: None   Collection Time    10/10/13 10:12 AM      Result Value Range Status   Specimen  Description BLOOD LEFT ARM   Final   Special Requests BOTTLES DRAWN AEROBIC AND ANAEROBIC 2CC   Final   Culture  Setup Time     Final   Value: 10/10/2013 14:16     Performed at [facility]   Culture     Final   Value: NO GROWTH 5 DAYS     Performed at [facility]   Report Status 10/16/2013 FINAL   Final  CULTURE, BLOOD (ROUTINE X 2)     Status: None   Collection Time    10/10/13 10:21 AM      Result Value Range Status   Specimen Description BLOOD LEFT HAND   Final   Special Requests     Final   Value: BOTTLES DRAWN AEROBIC AND ANAEROBIC AERO 10CC ANA [place]   Culture  Setup Time     Final   Value: 10/10/2013 14:16     Performed at [facility]   Culture     Final   Value: NO GROWTH 5 DAYS     Performed at [facility]   Report Status 10/16/2013 FINAL   Final  URINE CULTURE     Status: None   Collection Time    10/10/13  2:03 PM      Result Value Range Status   Specimen Description URINE, CLEAN CATCH   Final   Special  Requests NONE   Final   Culture  Setup Time     Final   Value: 10/10/2013 23:41     Performed at [place]     Final   Value: NO GROWTH     Performed at [facility]   Culture     Final   Value: NO GROWTH     Performed at [facility]   Report Status 10/11/2013 FINAL   Final     Labs: Basic Metabolic Panel:  Recent Labs Lab 10/12/13 0630 10/13/13 0421  NA 138 140  K 5.1 5.1  CL 107 109  CO2 22 20  GLUCOSE 151* 132*  BUN 35* 42*  CREATININE 1.18 1.35  CALCIUM 11.5* 11.3*   Liver Function Tests:  Recent Labs Lab 10/13/13 0421  AST 166*  ALT 128*  ALKPHOS 387*  BILITOT 1.4*  PROT 7.0  ALBUMIN 2.3*   CBC:  Recent Labs Lab 10/12/13 0552 10/13/13 0421  WBC 9.3 8.3  HGB 9.4* 9.1*  HCT 28.6* 28.2*  MCV 90.2 91.9  PLT 120* 111*   IMAGING STUDIES Dg Chest 2 View  10/13/2013   CLINICAL DATA:  Shortness of breath. Weakness. Multiple myeloma. Prostate carcinoma.   EXAM: CHEST  2 VIEW  COMPARISON:  None.  FINDINGS: Low lung volumes are noted. Mild opacity seen at the right lung base which may be due to atelectasis or infiltrate. Left lung appears clear. No evidence of pleural effusion.  IMPRESSION: Low lung volumes.  Mild right basilar atelectasis versus infiltrate.   Electronically Signed   By: Earle Gell M.D.   On: 10/03/2013 18:44   Ct Angio Chest Pe W/cm &/or Wo Cm  10/07/2013   ADDENDUM REPORT: 10/07/2013 16:01  ADDENDUM: In addition to the findings below, images through the upper abdomen show partial visualization of soft tissue density mass in the area of the pancreatic tail and splenic hilum. This could represent a primary pancreatic carcinoma and the source of the probable liver metastases. Abdomen MRI or CT should be considered for further evaluation.   Electronically Signed   By: Earle Gell M.D.   On: 10/07/2013 16:01   10/07/2013   CLINICAL DATA:  Shortness of breath. Elevated D-dimer. Clinical suspicion for pulmonary embolism. History of prostate carcinoma and multiple myeloma.  EXAM: CT ANGIOGRAPHY CHEST WITH CONTRAST  TECHNIQUE: Multidetector CT imaging of the chest was performed using the standard protocol during bolus administration of intravenous contrast. Multiplanar CT image reconstructions including MIPs were obtained to evaluate the vascular anatomy.  CONTRAST:  134m OMNIPAQUE IOHEXOL 350 MG/ML SOLN  COMPARISON:  None.  FINDINGS: Satisfactory opacification of pulmonary arteries noted, and no pulmonary emboli identified. No evidence of thoracic aortic dissection or aneurysm. No evidence of mediastinal hematoma or mass.  No lymphadenopathy identified within the thorax. Tiny pleural effusions versus pleural thickening noted bilaterally. Mild cardiomegaly noted. No evidence of pulmonary infiltrate or mass.  Images obtained through the upper abdomen show heterogeneous attenuation of the liver, which may be due to the presence of multiple hypovascular  liver metastases.  Review of the MIP images confirms the above findings.  IMPRESSION: No evidence of pulmonary embolism.  Mild cardiomegaly and tiny bilateral pleural effusions versus pleural thickening.  Heterogeneous appearance of liver noted, and multiple hypovascular metastases cannot be excluded. Recommend further imaging characterization with nonemergent abdomen MRI without and with contrast as the preferred exam.  Electronically Signed: By: JEarle GellM.D. On: 10/02/2013  00:07   Nm Hepatobiliary  10/02/2013   CLINICAL DATA:  Right upper quadrant abdominal pain. Question cholecystitis.  EXAM: NUCLEAR MEDICINE HEPATOBILIARY IMAGING  TECHNIQUE: Sequential images of the abdomen were obtained out to 60 minutes following intravenous administration of radiopharmaceutical.  COMPARISON:  Chest CT 10/22/2013.  Abdominal ultrasound 10/11/2013.  RADIOPHARMACEUTICALS:  5.69mi [id number]  FINDINGS: The initial images demonstrate hepatomegaly with diffusely heterogeneous hepatic activity corresponding with the suspected underlying masses on recent studies. Delayed imaging does demonstrate accumulation of activity within the gallbladder. There is progressive small bowel activity.  IMPRESSION: 1. The cystic and common bile ducts are patent. There is no evidence of cholecystitis. 2. Enlarged liver with diffusely heterogeneous activity. Recent CT and ultrasound demonstrate diffuse abnormality of the liver concerning for cirrhosis and possible widespread neoplasm. Further evaluation is recommended, ideally with abdominal MRI without and with contrast.   Electronically Signed   By: BiCamie Patience.D.   On: 10/02/2013 13:03   Nm Bone Scan Whole Body  10/10/2013   CLINICAL DATA:  Liver metastases  EXAM: NUCLEAR MEDICINE WHOLE BODY BONE SCAN  TECHNIQUE: Whole body anterior and posterior images were obtained approximately 3 hours after intravenous injection of radiopharmaceutical.  COMPARISON:  US[place]aIOPSY dated 10/06/2013; NM  HEPATOBILIARY INCLUDE GB dated 10/02/2013; CT ABD/PELVIS W CM dated 10/09/2013  RADIOPHARMACEUTICALS:  Twenty-five 0 Technetium-99 MDP  FINDINGS: Patient rotated leftward. No abnormal accumulation of radiotracer within the axillary or appendicular skeleton to suggest metastasis. Inferior sacrum is obscured by radiotracer in the bladder. Symmetric degenerate uptake noted proximal shoulders and sternoclavicular joints.  IMPRESSION: No scintigraphic evidence skeletal metastasis.   Electronically Signed   By: StSuzy Bouchard.D.   On: 10/10/2013 14:39   Us[place]oreabdomen Complete  09/30/2013   CLINICAL DATA:  Upper abdominal pain and shortness of breath. History multiple myeloma and prostate cancer.  EXAM: ULTRASOUND ABDOMEN COMPLETE  COMPARISON:  None.  FINDINGS: Gallbladder:  No evidence of cholelithiasis. Mild gallbladder wall thickening measuring 4.8 mm. Positive sonographic Murphy's sign.  Common bile duct:  Diameter: 6.7 to 8.1 mm.  Liver:  Heterogeneous echotexture with several rounded hypoechoic rimmed masses.  IVC:  No abnormality visualized.  Pancreas:  Visualized portion unremarkable.  Spleen:  Within normal in size with a 4.9 cm area of mild increased echogenicity as cannot exclude a mass.  Right Kidney:  Length: 11.9 cm. Two adjacent rounded hypo to anechoic masses over the upper pole with mild increased through transmission likely cysts. These measure 1.5 and 2.8 cm for respectively.  Left Kidney:  Length: 11.6 cm. Echogenicity within normal limits. No mass or hydronephrosis visualized.  Abdominal aorta:  3.3 cm in AP diameter proximally.  Other findings:  None.  IMPRESSION: Heterogeneous echotexture to the liver with several rounded hypoechoic rimmed masses which may represent metastatic disease. 4.9 cm hyperechoic region of the spleen as cannot exclude a mass. Recommend CT of the abdomen/ pelvis for further evaluation.  No evidence of cholelithiasis. Mild gallbladder wall thickening measuring 4.8 mm with  positive sonographic Murphy sign per recommend clinical correlation.  Two adjacent cysts over the superior pole of the right kidney measuring 1.5 and 2.8 cm respectively.  Minimal aneurysmal dilatation of the proximal abdominal aorta measuring 3.3 cm in AP diameter.   Electronically Signed   By: DaMarin Olp.D.   On: 10/13/2013 22:08   Ct Abdomen Pelvis W Contrast  10/09/2013   CLINICAL DATA:  [age]ear old male with abdominal and pelvic pain. Metastatic mucinous adenocarcinoma to  the liver recently diagnosed. Staging. Marland Kitchen  EXAM: CT ABDOMEN AND PELVIS WITH CONTRAST  TECHNIQUE: Multidetector CT imaging of the abdomen and pelvis was performed using the standard protocol following bolus administration of intravenous contrast.  CONTRAST:  140m OMNIPAQUE IOHEXOL 300 MG/ML  SOLN  COMPARISON:  09/30/2013 ultrasound and 10/03/2013 chest CT  FINDINGS: Cardiomegaly, tiny bilateral pleural effusions and left basilar atelectasis identified.  Innumerable masses throughout the liver are noted compatible with metastatic disease.  A 7.1 x 7.3 cm mass inseparable from the pancreatic tail and medial spleen identified.  The adrenal glands are unremarkable.  Bilateral renal cortical atrophy and right renal cysts are identified.  Small amount of ascites is present within the abdomen/pelvis.  No definite enlarged lymph nodes are identified. There is no evidence of abdominal aortic aneurysm or biliary dilatation.  A 5 x 7 x 11 cm filling defect along the posterior distal sigmoid colon/rectum is noted. Although this could represent slightly unusual appearing stool, a mass is not excluded and further evaluation/ direct inspection recommended.  No other bowel abnormalities are identified except for scattered colonic diverticular. There is no evidence of bowel obstruction, pneumoperitoneum or abscess.  Slight heterogeneity of the visualized bones noted and may be a reflection of multiple myeloma. Severe degenerative changes in both hips  noted.  IMPRESSION: 7.1 x 7.3 cm mass situated at the pancreatic tail and splenic hilum. Question primary pancreatic neoplasm versus metastasis.  Innumerable hepatic metastases.  5 x 7 x 11 cm filling defect along the posterior distal sigmoid colon/rectum. Although this could represent slightly unusual appearing stool, further evaluation/direct inspection is recommended to exclude colonic mass.  Slight bony heterogeneity which could be reflection of myeloma.  Cardiomegaly, tiny bilateral pleural effusions and left basilar atelectasis.   Electronically Signed   By: JHassan RowanM.D.   On: 10/09/2013 11:15   Nm Pulmonary Perf And Vent  10/03/2013   CLINICAL DATA:  Shortness of Breath  EXAM: NUCLEAR MEDICINE VENTILATION - PERFUSION LUNG SCAN  Views: Anterior, posterior, left lateral, right lateral, RPO, LPO, RAO, LAO-ventilation and perfusion  Radionuclide: Technetium 918mTPA -ventilation; Technetium 9919mcroaggregated albumin -perfusion  Dose:  40.0 mCi-ventilation; 6.0 mCi-perfusion  Route of administration: Inhalation-ventilation; intravenous-perfusion  COMPARISON:  Chest radiograph October 01, 2013  FINDINGS: Heart is noted to be enlarged. On the ventilation study, there is no focal defect. Uptake of radiotracer is essentially homogeneous and symmetric bilaterally on the ventilation study.  On the perfusion study, uptake appears homogeneous and symmetric bilaterally without appreciable defect.  There is no appreciable ventilation/perfusion mismatch.  IMPRESSION: No appreciable ventilation or perfusion defects. Very low probability of pulmonary embolus. Cardiomegaly is noted.   Electronically Signed   By: WilLowella GripD.   On: 10/03/2013 17:22   Us [place]  10/06/2013   CLINICAL DATA:  [age] old male with a history of malignant melanoma. He has multiple hepatic nodules as well as a mass in the region the pancreatic tail. Ultrasound-guided core biopsy is requested to facilitate the etiology of the presumed  metastatic disease.  EXAM: ULTRASOUND GUIDED core needle BIOPSY OF liver lesion  MEDICATIONS: 0.5 mg IV Versed; 25 mcg IV Fentanyl  Total Moderate Sedation Time: 16  PROCEDURE: The procedure, risks, benefits, and alternatives were explained to the patient. Questions regarding the procedure were encouraged and answered. The patient understands and consents to the procedure.  The right upper quadrant was prepped with Betadine in a sterile fashion, and a sterile drape was applied covering the operative  field. A sterile gown and sterile gloves were used for the procedure. Local anesthesia was provided with 1% Lidocaine.  The right upper quadrant was interrogated with ultrasound. The liver is diffusely heterogeneous. There are several fairly well-defined nodular opacities consistent with metastatic lesions. A suitable skin entry site was selected and marked. Local anesthesia was then obtained by infiltration with 1% lidocaine. Using real-time sonographic guidance, a 17 gauge trocar needle was carefully advanced into the right hemi liver into the margin of 1 of the lesions. Multiple 18 gauge core biopsies were then coaxially obtained using the BioPince automated biopsy device. As the biopsy needle was withdrawn, the tract was embolized with a Gel-Foam slurry. Post biopsy ultrasound imaging demonstrates no significant perihepatic hematoma or evidence of active hemorrhage.  The patient tolerated the procedure well appear  COMPLICATIONS: None.  IMPRESSION: Technically successful ultrasound-guided core biopsy of right hepatic lesion.  Signed,  Criselda Peaches, MD  [facility]  [facility]   Electronically Signed   By: Jacqulynn Cadet M.D.   On: 10/06/2013 13:55   Dg Chest Port 1 View  10/10/2013   CLINICAL DATA:  Fever and hypertension  EXAM: PORTABLE CHEST - 1 VIEW  COMPARISON:  Chest x-ray of October 01, 2012  FINDINGS: The lungs remain hypoinflated. The interstitial  markings are more prominent today than on the previous study. The coarse lung markings in the right infrahilar region are not well demonstrated due to the adjacent right hemidiaphragm. The cardiac silhouette remains enlarged. The central pulmonary vascularity is mildly prominent but cephalization is not demonstrated. The left hemidiaphragm is partially obscured today.  IMPRESSION: The findings suggest worsening of interstitial edema or interstitial infiltrates. There is partial obscuration of the left hemidiaphragm which may reflect atelectasis or adjacent pleural effusion. A followup PA and lateral chest x-ray with deep inspiration would be of value.   Electronically Signed   By: David  Martinique   On: 10/10/2013 10:06    DISCHARGE EXAMINATION: Filed Vitals:   10/17/13 1039 10/17/13 1525 10/17/13 2059 10/18/13 0443  BP: 140/85 137/84 110/73 121/80  Pulse: 105 90 111 118  Temp:  98 F (36.7 C) 98.3 F (36.8 C) 98.9 F (37.2 C)  TempSrc:  Oral Axillary Axillary  Resp:  20 20 20   Height:      Weight:      SpO2:  99% 97% 99%   General appearance: somnolent, barely arousable Resp: coarse breath sounds bilaterally GI: Obese, distended. Slightly tender.  DISPOSITION: Hospice  Discharge Orders   Future Orders Complete By Expires   Discharge diet:  As directed    Comments:     Comfort feeds      ALLERGIES: No Known Allergies  Current Discharge Medication List    START taking these medications   Details  Morphine Sulfate (MORPHINE CONCENTRATE) 10 mg / 0.5 ml concentrated solution Take 0.5 mLs (10 mg total) by mouth every 2 (two) hours as needed for moderate pain, severe pain, anxiety or shortness of breath. Qty: 30 mL, Refills: 0      CONTINUE these medications which have CHANGED   Details  metoprolol (LOPRESSOR) 50 MG tablet Take 1.5 tablets (75 mg total) by mouth 2 (two) times daily.      CONTINUE these medications which have NOT CHANGED   Details  latanoprost (XALATAN) 0.005 %  ophthalmic solution Place 1 drop into both eyes at bedtime.    sennosides-docusate sodium (SENOKOT-S) 8.6-50 MG tablet Take 1 tablet by mouth  2 (two) times daily.      STOP taking these medications     allopurinol (ZYLOPRIM) 100 MG tablet      aspirin EC 325 MG tablet      Calcium Carbonate-Vitamin D (CALCIUM 600+D) 600-400 MG-UNIT per tablet      Glucosamine-Chondroitin (GLUCOSAMINE CHONDR COMPLEX PO)      losartan (COZAAR) 25 MG tablet      Petrolatum-Zinc Oxide (SENSI-CARE PROTECTIVE BARRIER EX)      spironolactone (ALDACTONE) 25 MG tablet      terazosin (HYTRIN) 10 MG capsule          TOTAL DISCHARGE TIME: 35 mins  [facility]  [facility] Pager [phone]  10/18/2013, 9:55 AM

## 2013-10-18 NOTE — Telephone Encounter (Signed)
Pt's wife left a VM requests Dr. Alvy Bimler call the [facility] at ph # [phone] to arrange a transfer.   Wife's ph [phone].

## 2013-10-18 NOTE — Telephone Encounter (Signed)
FYI, we will not be arranging a transfer

## 2013-10-30 NOTE — Progress Notes (Signed)
Wasted morphine drip 80cc  In the sink witnessed by [place].

## 2013-10-30 NOTE — Progress Notes (Signed)
  patient was pronounced dead at 36;10 am. Son was at bedside.  discharged summary completed on 04/26/2059 Death certificate completed.

## 2013-10-30 NOTE — Progress Notes (Signed)
Son called for RN to come into room. Pt had no pulse and was not breathing. Pt is a DNR/palliative care. [name] And I pronounced pt as deceased. Dr. Clementeen Graham notified. Son, Gilbert Deleon in room.

## 2013-10-30 DEATH — deceased

## 2014-12-09 IMAGING — CT CT ABD-PELV W/ CM
2 of 5 series · 16 of 46 positions shown, 18 images · IV contrast (CONTRAST)
Comparison: 10/01/2013 ultrasound and 10/01/2013 chest CT

CLINICAL DATA: 78-year-old male with abdominal and pelvic pain.
Metastatic mucinous adenocarcinoma to the liver recently diagnosed.
Staging. .

EXAM:
CT ABDOMEN AND PELVIS WITH CONTRAST
TECHNIQUE: Multidetector CT imaging of the abdomen and pelvis was performed
using the standard protocol following bolus administration of
intravenous contrast.
CONTRAST:  100mL OMNIPAQUE IOHEXOL 300 MG/ML  SOLN

[Series 2: routine · axial · 0.97mm/px · z∈[-35,+440]mm · 13 of 107 slices shown, 15 images]
[im 6/107  soft-tissue]
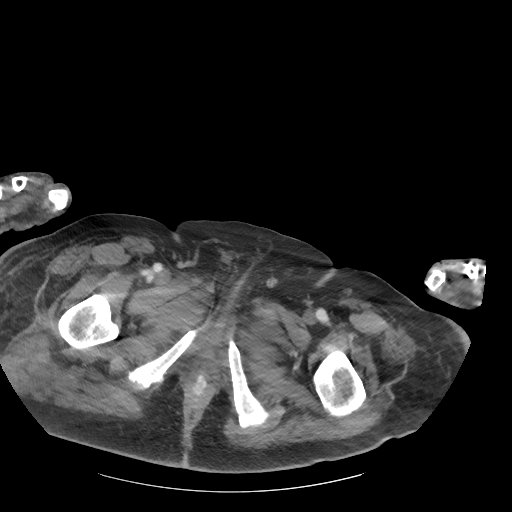
[im 6/107  bone]
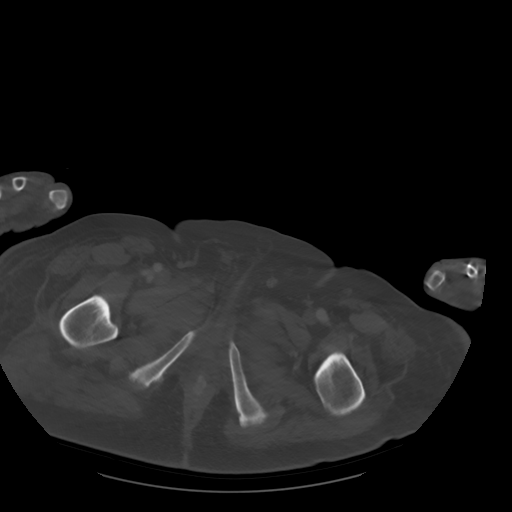
[im 17/107  soft-tissue]
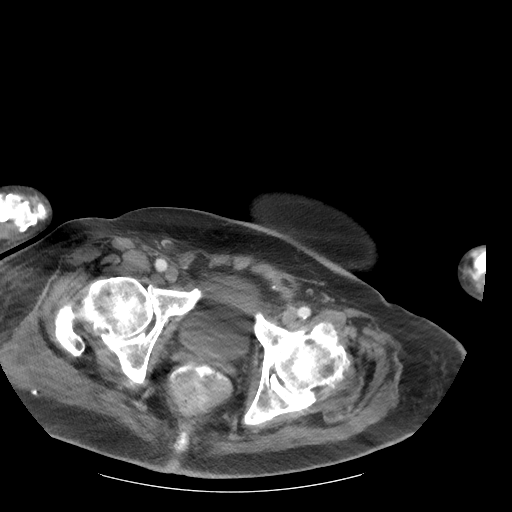
[im 23/107  soft-tissue]
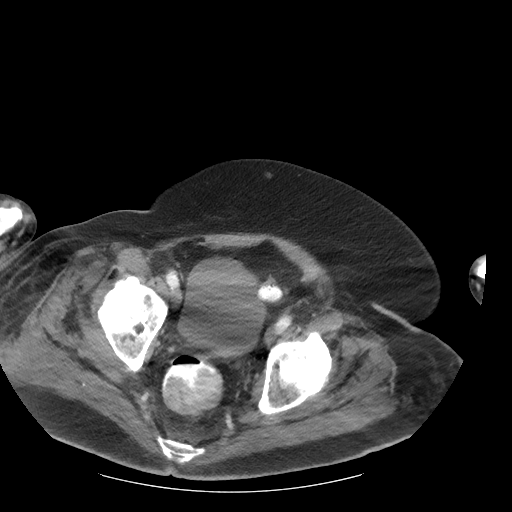
[im 28/107  soft-tissue]
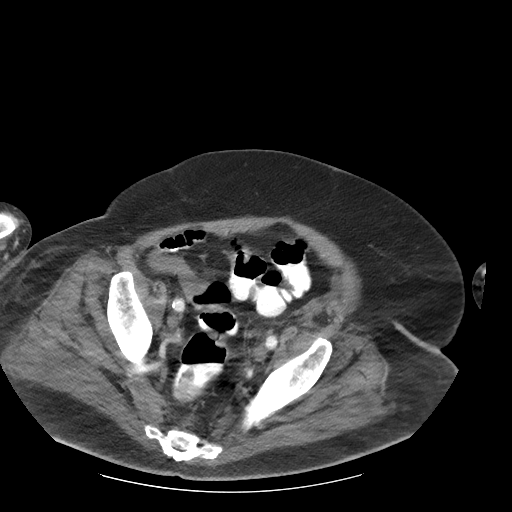
[im 40/107  soft-tissue]
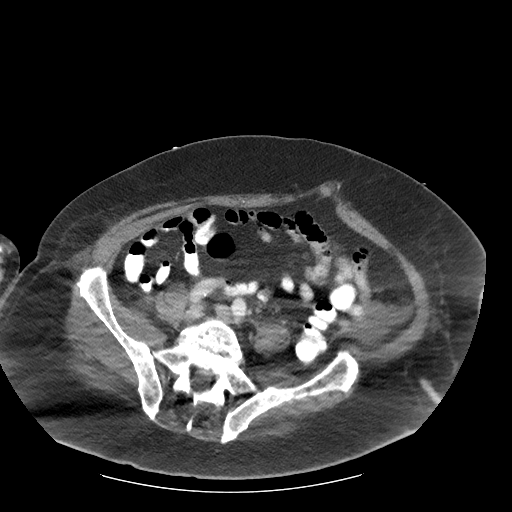
[im 45/107  soft-tissue]
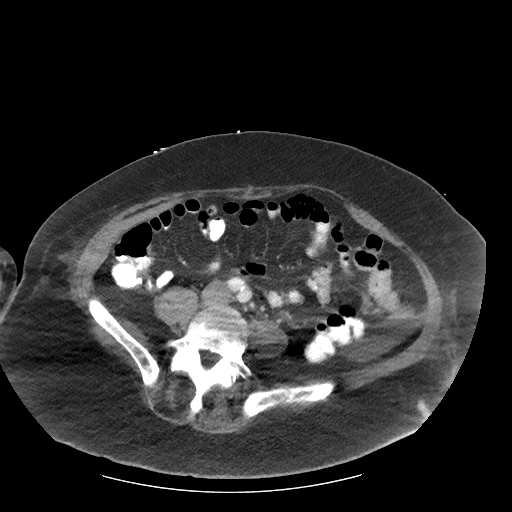
[im 56/107  soft-tissue]
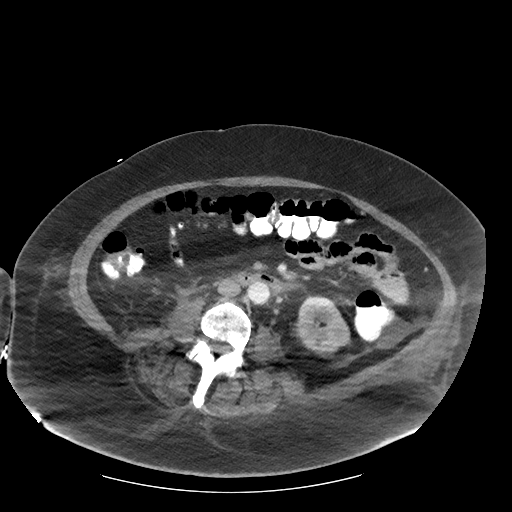
[im 62/107  soft-tissue]
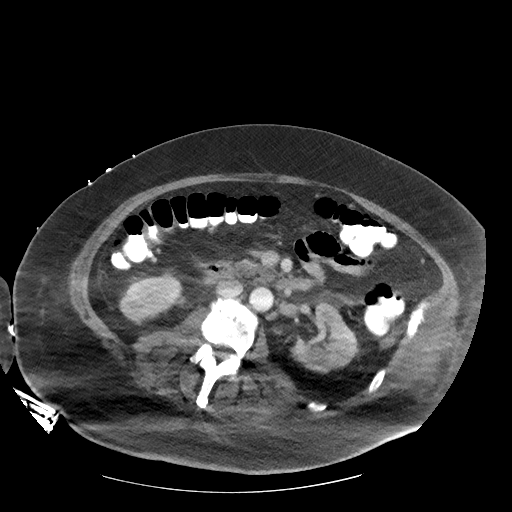
[im 67/107  soft-tissue]
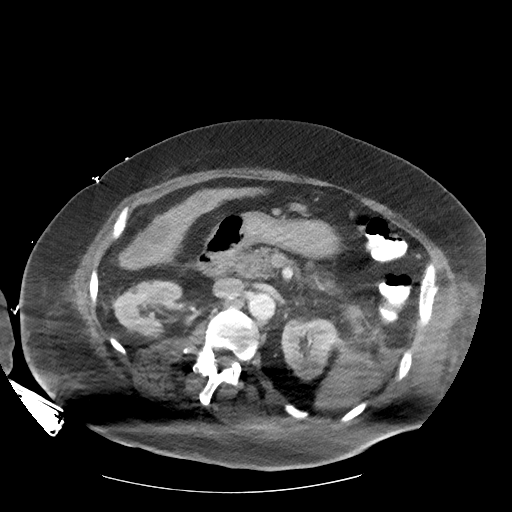
[im 67/107  bone]
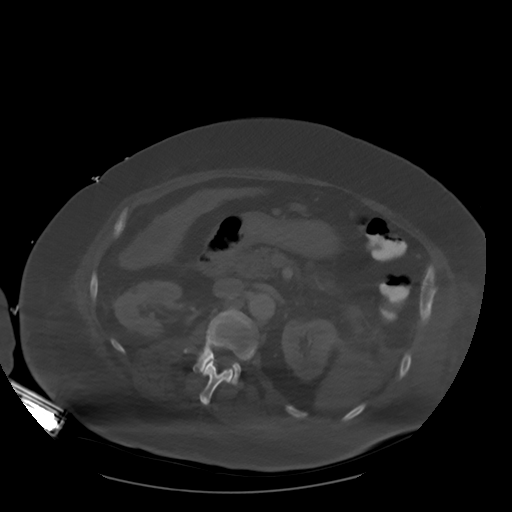
[im 79/107  soft-tissue]
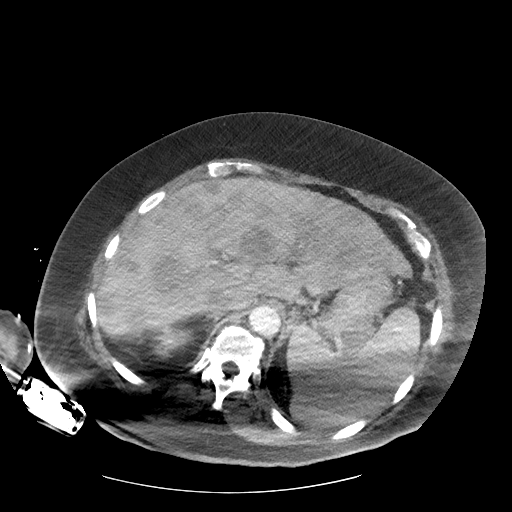
[im 84/107  soft-tissue]
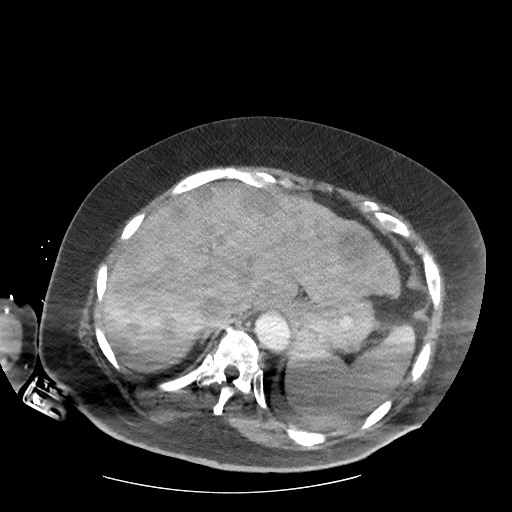
[im 90/107  soft-tissue]
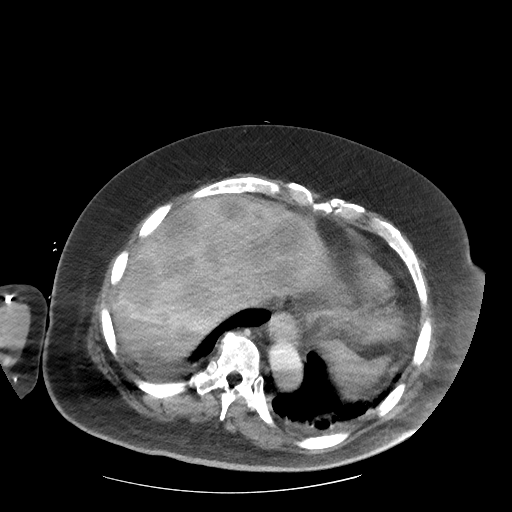
[im 101/107  soft-tissue]
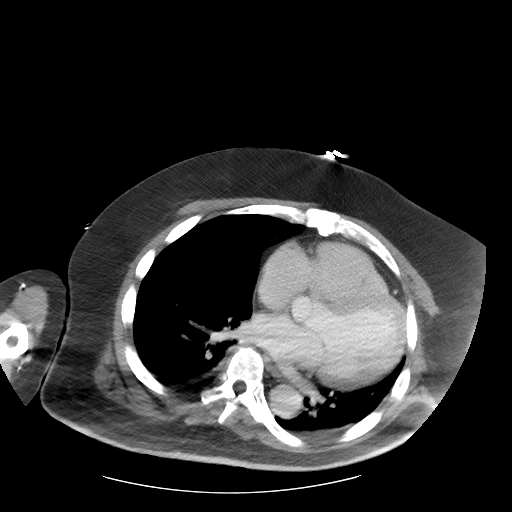

[Series 8044: coronal · coronal · 1.03mm/px · 3 of 151 slices shown]
[im 51/151  soft-tissue]
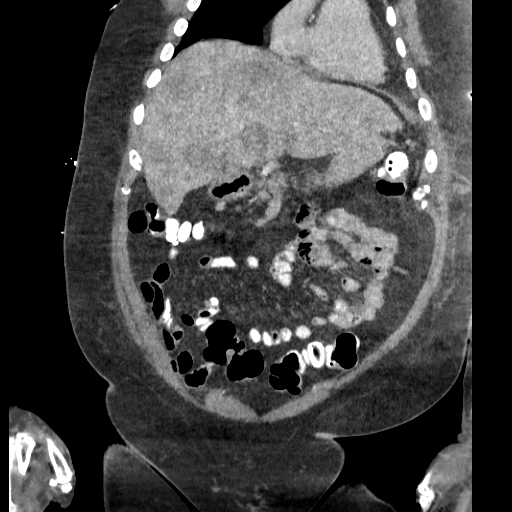
[im 67/151  soft-tissue]
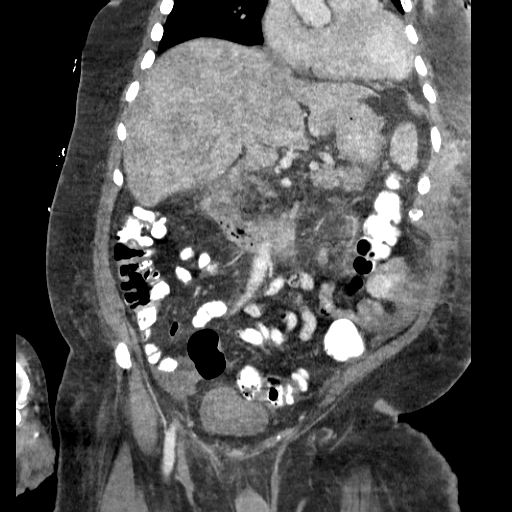
[im 84/151  soft-tissue]
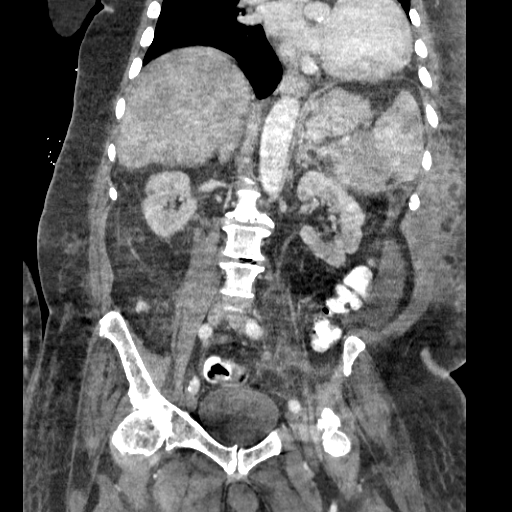

[16 of 46 positions shown; findings below may reference images not displayed]

FINDINGS: Cardiomegaly, tiny bilateral pleural effusions and left basilar
atelectasis identified.

Innumerable masses throughout the liver are noted compatible with
metastatic disease.

A 7.1 x 7.3 cm mass inseparable from the pancreatic tail and medial
spleen identified.

The adrenal glands are unremarkable.

Bilateral renal cortical atrophy and right renal cysts are
identified.

Small amount of ascites is present within the abdomen/pelvis.

No definite enlarged lymph nodes are identified. There is no
evidence of abdominal aortic aneurysm or biliary dilatation.

A 5 x 7 x 11 cm filling defect along the posterior distal sigmoid
colon/rectum is noted. Although this could represent slightly
unusual appearing stool, a mass is not excluded and further
evaluation/ direct inspection recommended.

No other bowel abnormalities are identified except for scattered
colonic diverticular. There is no evidence of bowel obstruction,
pneumoperitoneum or abscess.

Slight heterogeneity of the visualized bones noted and may be a
reflection of multiple myeloma. Severe degenerative changes in both
hips noted.
IMPRESSION: 7.1 x 7.3 cm mass situated at the pancreatic tail and splenic hilum.
Question primary pancreatic neoplasm versus metastasis.

Innumerable hepatic metastases.

5 x 7 x 11 cm filling defect along the posterior distal sigmoid
colon/rectum. Although this could represent slightly unusual
appearing stool, further evaluation/direct inspection is recommended
to exclude colonic mass.

Slight bony heterogeneity which could be reflection of myeloma.

Cardiomegaly, tiny bilateral pleural effusions and left basilar
atelectasis.
